# Patient Record
Sex: Female | Born: 1980 | Race: Black or African American | Hispanic: No | Marital: Single | Smoking: Never smoker
Health system: Southern US, Community
[De-identification: ages and names within clinical notes are randomized; demographics above are authoritative.]

## PROBLEM LIST (undated history)

## (undated) DIAGNOSIS — J45909 Unspecified asthma, uncomplicated: Secondary | ICD-10-CM

## (undated) DIAGNOSIS — I10 Essential (primary) hypertension: Secondary | ICD-10-CM

---

## 2014-06-19 ENCOUNTER — Encounter (HOSPITAL_BASED_OUTPATIENT_CLINIC_OR_DEPARTMENT_OTHER): Payer: Self-pay

## 2014-06-19 ENCOUNTER — Emergency Department (HOSPITAL_BASED_OUTPATIENT_CLINIC_OR_DEPARTMENT_OTHER)
Admission: EM | Admit: 2014-06-19 | Discharge: 2014-06-19 | Disposition: A | Discharge status: Home / Self Care | Payer: Medicaid Other | Attending: Emergency Medicine | Admitting: Emergency Medicine

## 2014-06-19 ENCOUNTER — Emergency Department (HOSPITAL_BASED_OUTPATIENT_CLINIC_OR_DEPARTMENT_OTHER): Payer: Medicaid Other

## 2014-06-19 DIAGNOSIS — M791 Myalgia: Secondary | ICD-10-CM | POA: Diagnosis not present

## 2014-06-19 DIAGNOSIS — M722 Plantar fascial fibromatosis: Secondary | ICD-10-CM | POA: Insufficient documentation

## 2014-06-19 DIAGNOSIS — M5441 Lumbago with sciatica, right side: Secondary | ICD-10-CM | POA: Diagnosis not present

## 2014-06-19 DIAGNOSIS — J45909 Unspecified asthma, uncomplicated: Secondary | ICD-10-CM | POA: Diagnosis not present

## 2014-06-19 DIAGNOSIS — M79671 Pain in right foot: Secondary | ICD-10-CM | POA: Diagnosis present

## 2014-06-19 HISTORY — DX: Unspecified asthma, uncomplicated: J45.909

## 2014-06-19 MED ORDER — CYCLOBENZAPRINE HCL 10 MG PO TABS: 10.0000 mg | ORAL_TABLET | Freq: Two times a day (BID) | ORAL | Status: DC | PRN

## 2014-06-19 MED ORDER — IBUPROFEN 800 MG PO TABS
800.0000 mg | ORAL_TABLET | Freq: Once | ORAL | Status: AC
  Administered 2014-06-19: 800 mg via ORAL
  Filled 2014-06-19: qty 1

## 2014-06-19 MED ORDER — IBUPROFEN 800 MG PO TABS: 800.0000 mg | ORAL_TABLET | Freq: Three times a day (TID) | ORAL | Status: DC

## 2014-06-19 NOTE — ED Provider Notes (Signed)
CSN: [id number]     Arrival date & time 06/19/14  1055 History   First MD Initiated Contact with Patient 06/19/14 1159     Chief Complaint  Patient presents with  . Foot Pain     (Consider location/radiation/quality/duration/timing/severity/associated sxs/prior Treatment) HPI Barbara Williams is a 34 year old female past medical history of asthma who presents the ER complaining of 2 days of right foot pain, along with 2 weeks of right-sided lower back pain which radiates into her knee. Patient states her symptoms began gradually, beginning with right-sided lower back pain which has a burning, tingling sensation in her right posterior thigh and right knee. Patient states she works on her feet up Department sore and typically wears shoes that are flats. She states over the past several days she notes some mild swelling to the bottom of her foot. She states she has increased pain with walking, and typically the first several steps she takes are the most painful. Patient denies injury to her back or foot. Patient denies numbness, weakness, nausea, vomiting, fever, saddle anesthesia, bowel/bladder incontinence/retention.  Past Medical History  Diagnosis Date  . Asthma    History reviewed. No pertinent past surgical history. No family history on file. History  Substance Use Topics  . Smoking status: Never Smoker   . Smokeless tobacco: Not on file  . Alcohol Use: Not on file   OB History    No data available     Review of Systems  Musculoskeletal: Positive for myalgias and arthralgias.  Neurological: Negative for weakness and numbness.      Allergies  Review of patient's allergies indicates no known allergies.  Home Medications   Prior to Admission medications   Medication Sig Start Date End Date Taking? Authorizing Provider  cyclobenzaprine (FLEXERIL) 10 MG tablet Take 1 tablet (10 mg total) by mouth 2 (two) times daily as needed for muscle spasms. 06/19/14   Monte Fantasia, PA-C    ibuprofen (ADVIL,MOTRIN) 800 MG tablet Take 800 mg by mouth every 8 (eight) hours as needed.   Yes Historical Provider, MD  ibuprofen (ADVIL,MOTRIN) 800 MG tablet Take 1 tablet (800 mg total) by mouth 3 (three) times daily. 06/19/14   Monte Fantasia, PA-C   Ht 5' (1.524 m)  Wt 180 lb (81.647 kg)  BMI 35.15 kg/m2  LMP 06/13/2014 Physical Exam  Constitutional: She is oriented to person, place, and time. She appears well-developed and well-nourished. No distress.  HENT:  Head: Normocephalic and atraumatic.  Eyes: Right eye exhibits no discharge. Left eye exhibits no discharge. No scleral icterus.  Neck: Normal range of motion.  Pulmonary/Chest: Effort normal. No respiratory distress.  Musculoskeletal: Normal range of motion.       Cervical back: Normal.       Thoracic back: Normal.       Lumbar back: She exhibits normal range of motion, no tenderness, no bony tenderness, no swelling and no edema.       Back:  Tenderness to palpation of the plantar aspect of calcaneal region and foot. No obvious erythema, edema, warmth, deformity. DP pulse 2+. Patient has full active and passive range of motion of her hip, knee, ankle.  Tenderness palpation also noted over right SI joint.  Neurological: She is alert and oriented to person, place, and time. She has normal strength. No cranial nerve deficit or sensory deficit. She displays a negative Romberg sign. Gait normal. GCS eye subscore is 4. GCS verbal subscore is 5. GCS motor subscore is 6.  Patient fully alert, answering questions appropriately in full, clear sentences. Cranial nerve II through XII grossly intact. Motor strength 5 out of 5 in all major muscle groups of upper and lower extremities. Distal sensation intact.  Skin: Skin is warm and dry. She is not diaphoretic.  Psychiatric: She has a normal mood and affect.  Nursing note and vitals reviewed.   ED Course  Procedures (including critical care time) Labs Review Labs Reviewed - No data  to display  Imaging Review Dg Foot Complete Right  06/19/2014   CLINICAL DATA:  Patient with increasing diffuse foot pain for 2 weeks. Status post lipoma removal 4 years ago. No trauma.  EXAM: RIGHT FOOT COMPLETE - 3+ VIEW  COMPARISON:  None.  FINDINGS: Normal anatomic alignment. No evidence for acute fracture or dislocation. No significant osseous degenerative change. Regional soft tissues unremarkable.  IMPRESSION: No acute osseous abnormality.   Electronically Signed   By: Annia Beltrew  Davis M.D.   On: 06/19/2014 12:59     EKG Interpretation None      MDM   Final diagnoses:  Plantar fasciitis of right foot  Right-sided low back pain with right-sided sciatica    Patient with back pain.  No neurological deficits and normal neuro exam.  Patient can walk but states is painful.  No loss of bowel or bladder control.  No concern for cauda equina.  No fever, night sweats, weight loss, h/o cancer, IVDU.  Patient's foot pain consistent with plantar fasciitis. Radiographs unremarkable for acute pathology. RICE protocol and pain medicine indicated and discussed with patient. I discussed conservative therapy with patient, discussed return precautions with patient, and strongly encouraged patient to follow-up with her primary care provider. I encouraged patient to call or return to the ER with any worsening of symptoms or should she have any questions or concerns.  Ht 5' (1.524 m)  Wt 180 lb (81.647 kg)  BMI 35.15 kg/m2  LMP 06/13/2014  Signed,  Ladona MowJoe Anzleigh Slaven, PA-C 2:00 AM    Monte FantasiaJoseph W Lorrain Rivers, PA-C 06/21/14 0200  Mirian MoMatthew Gentry, MD 06/23/14 [phone]

## 2014-06-19 NOTE — ED Notes (Signed)
Patient here with 2 weeks of right foot pain, denies injury. Reports increased pain with ambulation and increased swelling, now pain radiating up right leg

## 2014-06-19 NOTE — Discharge Instructions (Signed)
Your x-ray today was normal. Follow-up with your primary care doctor. Return to the ER if any worsening of symptoms, high fever, nausea, vomiting, numbness especially between your legs or groin, trouble with bowels or bladder, weakness, loss of function or sensation of any of your limbs.   Plantar Fasciitis Plantar fasciitis is a common condition that causes foot pain. It is soreness (inflammation) of the band of tough fibrous tissue on the bottom of the foot that runs from the heel bone (calcaneus) to the ball of the foot. The cause of this soreness may be from excessive standing, poor fitting shoes, running on hard surfaces, being overweight, having an abnormal walk, or overuse (this is common in runners) of the painful foot or feet. It is also common in aerobic exercise dancers and ballet dancers. SYMPTOMS  Most people with plantar fasciitis complain of:  Severe pain in the morning on the bottom of their foot especially when taking the first steps out of bed. This pain recedes after a few minutes of walking.  Severe pain is experienced also during walking following a long period of inactivity.  Pain is worse when walking barefoot or up stairs DIAGNOSIS   Your caregiver will diagnose this condition by examining and feeling your foot.  Special tests such as X-rays of your foot, are usually not needed. PREVENTION   Consult a sports medicine professional before beginning a new exercise program.  Walking programs offer a good workout. With walking there is a lower chance of overuse injuries common to runners. There is less impact and less jarring of the joints.  Begin all new exercise programs slowly. If problems or pain develop, decrease the amount of time or distance until you are at a comfortable level.  Wear good shoes and replace them regularly.  Stretch your foot and the heel cords at the back of the ankle (Achilles tendon) both before and after exercise.  Run or exercise on even  surfaces that are not hard. For example, asphalt is better than pavement.  Do not run barefoot on hard surfaces.  If using a treadmill, vary the incline.  Do not continue to workout if you have foot or joint problems. Seek professional help if they do not improve. HOME CARE INSTRUCTIONS   Avoid activities that cause you pain until you recover.  Use ice or cold packs on the problem or painful areas after working out.  Only take over-the-counter or prescription medicines for pain, discomfort, or fever as directed by your caregiver.  Soft shoe inserts or athletic shoes with air or gel sole cushions may be helpful.  If problems continue or become more severe, consult a sports medicine caregiver or your own health care provider. Cortisone is a potent anti-inflammatory medication that may be injected into the painful area. You can discuss this treatment with your caregiver. MAKE SURE YOU:   Understand these instructions.  Will watch your condition.  Will get help right away if you are not doing well or get worse. Document Released: 12/26/2000 Document Revised: 06/25/2011 Document Reviewed: 02/25/2008 [facility] Patient Information 2015 [place], [place]. This information is not intended to replace advice given to you by your health care provider. Make sure you discuss any questions you have with your health care provider.  Sciatica Sciatica is pain, weakness, numbness, or tingling along the path of the sciatic nerve. The nerve starts in the lower back and runs down the back of each leg. The nerve controls the muscles in the lower leg and  in the back of the knee, while also providing sensation to the back of the thigh, lower leg, and the sole of your foot. Sciatica is a symptom of another medical condition. For instance, nerve damage or certain conditions, such as a herniated disk or bone spur on the spine, pinch or put pressure on the sciatic nerve. This causes the pain, weakness, or other  sensations normally associated with sciatica. Generally, sciatica only affects one side of the body. CAUSES   Herniated or slipped disc.  Degenerative disk disease.  A pain disorder involving the narrow muscle in the buttocks (piriformis syndrome).  Pelvic injury or fracture.  Pregnancy.  Tumor (rare). SYMPTOMS  Symptoms can vary from mild to very severe. The symptoms usually travel from the low back to the buttocks and down the back of the leg. Symptoms can include:  Mild tingling or dull aches in the lower back, leg, or hip.  Numbness in the back of the calf or sole of the foot.  Burning sensations in the lower back, leg, or hip.  Sharp pains in the lower back, leg, or hip.  Leg weakness.  Severe back pain inhibiting movement. These symptoms may get worse with coughing, sneezing, laughing, or prolonged sitting or standing. Also, being overweight may worsen symptoms. DIAGNOSIS  Your caregiver will perform a physical exam to look for common symptoms of sciatica. He or she may ask you to do certain movements or activities that would trigger sciatic nerve pain. Other tests may be performed to find the cause of the sciatica. These may include:  Blood tests.  X-rays.  Imaging tests, such as an MRI or CT scan. TREATMENT  Treatment is directed at the cause of the sciatic pain. Sometimes, treatment is not necessary and the pain and discomfort goes away on its own. If treatment is needed, your caregiver may suggest:  Over-the-counter medicines to relieve pain.  Prescription medicines, such as anti-inflammatory medicine, muscle relaxants, or narcotics.  Applying heat or ice to the painful area.  Steroid injections to lessen pain, irritation, and inflammation around the nerve.  Reducing activity during periods of pain.  Exercising and stretching to strengthen your abdomen and improve flexibility of your spine. Your caregiver may suggest losing weight if the extra weight makes  the back pain worse.  Physical therapy.  Surgery to eliminate what is pressing or pinching the nerve, such as a bone spur or part of a herniated disk. HOME CARE INSTRUCTIONS   Only take over-the-counter or prescription medicines for pain or discomfort as directed by your caregiver.  Apply ice to the affected area for 20 minutes, 3-4 times a day for the first 48-72 hours. Then try heat in the same way.  Exercise, stretch, or perform your usual activities if these do not aggravate your pain.  Attend physical therapy sessions as directed by your caregiver.  Keep all follow-up appointments as directed by your caregiver.  Do not wear high heels or shoes that do not provide proper support.  Check your mattress to see if it is too soft. A firm mattress may lessen your pain and discomfort. SEEK IMMEDIATE MEDICAL CARE IF:   You lose control of your bowel or bladder (incontinence).  You have increasing weakness in the lower back, pelvis, buttocks, or legs.  You have redness or swelling of your back.  You have a burning sensation when you urinate.  You have pain that gets worse when you lie down or awakens you at night.  Your pain  is worse than you have experienced in the past.  Your pain is lasting longer than 4 weeks.  You are suddenly losing weight without reason. MAKE SURE YOU:  Understand these instructions.  Will watch your condition.  Will get help right away if you are not doing well or get worse. Document Released: 03/27/2001 Document Revised: 10/02/2011 Document Reviewed: 08/12/2011 [facility] Patient Information 2015 [place], [place]. This information is not intended to replace advice given to you by your health care provider. Make sure you discuss any questions you have with your health care provider.   Back Exercises Back exercises help treat and prevent back injuries. The goal of back exercises is to increase the strength of your abdominal and back muscles and the  flexibility of your back. These exercises should be started when you no longer have back pain. Back exercises include:  Pelvic Tilt. Lie on your back with your knees bent. Tilt your pelvis until the lower part of your back is against the floor. Hold this position 5 to 10 sec and repeat 5 to 10 times.  Knee to Chest. Pull first 1 knee up against your chest and hold for 20 to 30 seconds, repeat this with the other knee, and then both knees. This may be done with the other leg straight or bent, whichever feels better.  Sit-Ups or Curl-Ups. Bend your knees 90 degrees. Start with tilting your pelvis, and do a partial, slow sit-up, lifting your trunk only 30 to 45 degrees off the floor. Take at least 2 to 3 seconds for each sit-up. Do not do sit-ups with your knees out straight. If partial sit-ups are difficult, simply do the above but with only tightening your abdominal muscles and holding it as directed.  Hip-Lift. Lie on your back with your knees flexed 90 degrees. Push down with your feet and shoulders as you raise your hips a couple inches off the floor; hold for 10 seconds, repeat 5 to 10 times.  Back arches. Lie on your stomach, propping yourself up on bent elbows. Slowly press on your hands, causing an arch in your low back. Repeat 3 to 5 times. Any initial stiffness and discomfort should lessen with repetition over time.  Shoulder-Lifts. Lie face down with arms beside your body. Keep hips and torso pressed to floor as you slowly lift your head and shoulders off the floor. Do not overdo your exercises, especially in the beginning. Exercises may cause you some mild back discomfort which lasts for a few minutes; however, if the pain is more severe, or lasts for more than 15 minutes, do not continue exercises until you see your caregiver. Improvement with exercise therapy for back problems is slow.  See your caregivers for assistance with developing a proper back exercise program. Document Released:  05/10/2004 Document Revised: 06/25/2011 Document Reviewed: 02/01/2011 [facility] Patient Information 2015 [place], [place]. This information is not intended to replace advice given to you by your health care provider. Make sure you discuss any questions you have with your health care provider.

## 2015-02-24 ENCOUNTER — Emergency Department (HOSPITAL_BASED_OUTPATIENT_CLINIC_OR_DEPARTMENT_OTHER): Payer: Medicaid Other

## 2015-02-24 ENCOUNTER — Encounter (HOSPITAL_BASED_OUTPATIENT_CLINIC_OR_DEPARTMENT_OTHER): Payer: Self-pay | Admitting: *Deleted

## 2015-02-24 ENCOUNTER — Emergency Department (HOSPITAL_BASED_OUTPATIENT_CLINIC_OR_DEPARTMENT_OTHER)
Admission: EM | Admit: 2015-02-24 | Discharge: 2015-02-24 | Disposition: A | Discharge status: Home / Self Care | Payer: Medicaid Other | Attending: Emergency Medicine | Admitting: Emergency Medicine

## 2015-02-24 DIAGNOSIS — Z79899 Other long term (current) drug therapy: Secondary | ICD-10-CM | POA: Insufficient documentation

## 2015-02-24 DIAGNOSIS — R05 Cough: Secondary | ICD-10-CM | POA: Diagnosis present

## 2015-02-24 DIAGNOSIS — J189 Pneumonia, unspecified organism: Secondary | ICD-10-CM

## 2015-02-24 DIAGNOSIS — J159 Unspecified bacterial pneumonia: Secondary | ICD-10-CM | POA: Insufficient documentation

## 2015-02-24 DIAGNOSIS — Z79818 Long term (current) use of other agents affecting estrogen receptors and estrogen levels: Secondary | ICD-10-CM | POA: Diagnosis not present

## 2015-02-24 DIAGNOSIS — J45901 Unspecified asthma with (acute) exacerbation: Secondary | ICD-10-CM | POA: Diagnosis not present

## 2015-02-24 DIAGNOSIS — J181 Lobar pneumonia, unspecified organism: Secondary | ICD-10-CM

## 2015-02-24 DIAGNOSIS — R51 Headache: Secondary | ICD-10-CM | POA: Insufficient documentation

## 2015-02-24 MED ORDER — ALBUTEROL SULFATE (2.5 MG/3ML) 0.083% IN NEBU: 2.5000 mg | INHALATION_SOLUTION | Freq: Four times a day (QID) | RESPIRATORY_TRACT | Status: DC | PRN

## 2015-02-24 MED ORDER — PREDNISONE 20 MG PO TABS: 40.0000 mg | ORAL_TABLET | Freq: Every day | ORAL | Status: DC

## 2015-02-24 MED ORDER — ALBUTEROL SULFATE (2.5 MG/3ML) 0.083% IN NEBU
5.0000 mg | INHALATION_SOLUTION | Freq: Once | RESPIRATORY_TRACT | Status: AC
Start: 2015-02-24 — End: 2015-02-24
  Administered 2015-02-24: 5 mg via RESPIRATORY_TRACT
  Filled 2015-02-24: qty 6

## 2015-02-24 MED ORDER — AZITHROMYCIN 250 MG PO TABS: 250.0000 mg | ORAL_TABLET | Freq: Every day | ORAL | Status: DC

## 2015-02-24 MED ORDER — PREDNISONE 10 MG PO TABS
60.0000 mg | ORAL_TABLET | Freq: Once | ORAL | Status: AC
  Administered 2015-02-24: 60 mg via ORAL
  Filled 2015-02-24 (×2): qty 1

## 2015-02-24 MED ORDER — ALBUTEROL SULFATE HFA 108 (90 BASE) MCG/ACT IN AERS
1.0000 | INHALATION_SPRAY | RESPIRATORY_TRACT | Status: DC | PRN
  Administered 2015-02-24: 2 via RESPIRATORY_TRACT
  Filled 2015-02-24: qty 6.7

## 2015-02-24 MED ORDER — IPRATROPIUM BROMIDE 0.02 % IN SOLN
0.5000 mg | Freq: Once | RESPIRATORY_TRACT | Status: AC
  Administered 2015-02-24: 0.5 mg via RESPIRATORY_TRACT
  Filled 2015-02-24: qty 2.5

## 2015-02-24 NOTE — ED Notes (Signed)
MD at bedside. 

## 2015-02-24 NOTE — ED Notes (Signed)
Patient states she has a productive cough with greenish secretions and is associated nausea, bodyaches, headache and possible fever.  Has been using multiple symptom OTC meds.

## 2015-02-24 NOTE — ED Provider Notes (Signed)
CSN: [id number]     Arrival date & time 02/24/15  4098 History   First MD Initiated Contact with Patient 02/24/15 [phone]     Chief Complaint  Patient presents with  . Cough     (Consider location/radiation/quality/duration/timing/severity/associated sxs/prior Treatment) Patient is a 34 y.o. female presenting with cough. The history is provided by the patient.  Cough Cough characteristics:  Productive and hacking Sputum characteristics:  Green Severity:  Moderate Onset quality:  Gradual Duration:  3 days Timing:  Constant Progression:  Worsening Chronicity:  New Smoker: no   Context: sick contacts and upper respiratory infection   Relieved by:  Nothing Worsened by:  Activity and lying down Ineffective treatments:  Beta-agonist inhaler and decongestant Associated symptoms: chest pain, chills, fever, headaches, myalgias, shortness of breath and wheezing   Risk factors: no recent travel     Past Medical History  Diagnosis Date  . Asthma    History reviewed. No pertinent past surgical history. No family history on file. Social History  Substance Use Topics  . Smoking status: Never Smoker   . Smokeless tobacco: None  . Alcohol Use: No   OB History    No data available     Review of Systems  Constitutional: Positive for fever and chills.  Respiratory: Positive for cough, shortness of breath and wheezing.   Cardiovascular: Positive for chest pain.  Musculoskeletal: Positive for myalgias.  Neurological: Positive for headaches.  All other systems reviewed and are negative.     Allergies  Review of patient's allergies indicates no known allergies.  Home Medications   Prior to Admission medications   Medication Sig Start Date End Date Taking? Authorizing Provider  etonogestrel (IMPLANON) 68 MG IMPL implant 1 each by Subdermal route once.   Yes Historical Provider, MD  Nebulizers MISC by Does not apply route.   Yes Historical Provider, MD  albuterol (PROVENTIL) (2.5  MG/3ML) 0.083% nebulizer solution Take 3 mLs (2.5 mg total) by nebulization every 6 (six) hours as needed for wheezing or shortness of breath. 02/24/15   Gwyneth Sprout, MD  azithromycin (ZITHROMAX) 250 MG tablet Take 1 tablet (250 mg total) by mouth daily. Take first 2 tablets together, then 1 every day until finished. 02/24/15   Gwyneth Sprout, MD  cyclobenzaprine (FLEXERIL) 10 MG tablet Take 1 tablet (10 mg total) by mouth 2 (two) times daily as needed for muscle spasms. 06/19/14   Ladona Mow, PA-C  ibuprofen (ADVIL,MOTRIN) 800 MG tablet Take 800 mg by mouth every 8 (eight) hours as needed.    Historical Provider, MD  ibuprofen (ADVIL,MOTRIN) 800 MG tablet Take 1 tablet (800 mg total) by mouth 3 (three) times daily. 06/19/14   Ladona Mow, PA-C  predniSONE (DELTASONE) 20 MG tablet Take 2 tablets (40 mg total) by mouth daily. 02/24/15   Gwyneth Sprout, MD   BP 131/92 mmHg  Pulse 83  Temp(Src) 99.6 F (37.6 C) (Oral)  Resp 18  Ht 5' (1.524 m)  Wt 180 lb (81.647 kg)  BMI 35.15 kg/m2  SpO2 99%  LMP 02/16/2015 Physical Exam  Constitutional: She is oriented to person, place, and time. She appears well-developed and well-nourished. No distress.  HENT:  Head: Normocephalic and atraumatic.  Right Ear: Tympanic membrane normal.  Left Ear: Tympanic membrane normal.  Nose: Mucosal edema and rhinorrhea present.  Mouth/Throat: Posterior oropharyngeal erythema present.  Eyes: Conjunctivae and EOM are normal. Pupils are equal, round, and reactive to light.  Neck: Normal range of motion. Neck supple.  Cardiovascular:  Normal rate, regular rhythm and intact distal pulses.   No murmur heard. Pulmonary/Chest: Effort normal. No respiratory distress. She has decreased breath sounds in the left lower field. She has wheezes. She has rhonchi in the left lower field.  Abdominal: Soft. She exhibits no distension. There is no tenderness. There is no rebound and no guarding.  Musculoskeletal: Normal range of  motion. She exhibits no edema or tenderness.  Lymphadenopathy:    She has no cervical adenopathy.  Neurological: She is alert and oriented to person, place, and time.  Skin: Skin is warm and dry. No rash noted. No erythema.  Psychiatric: She has a normal mood and affect. Her behavior is normal.  Nursing note and vitals reviewed.   ED Course  Procedures (including critical care time) Labs Review Labs Reviewed - No data to display  Imaging Review Dg Chest 2 View  02/24/2015  CLINICAL DATA:  Cough. EXAM: CHEST - 2 VIEW COMPARISON:  Chest x-ray 05/10/2011 FINDINGS: The heart size is normal. Ill-defined medial left lower lobe airspace disease is noted on both the PA and lateral views. Right lung is clear. There is no edema or effusion. The visualized soft tissues and bony thorax are unremarkable. IMPRESSION: 1. Medial posterior left lower lobe pneumonia. Electronically Signed   By: Marin Robertshristopher  Mattern M.D.   On: 02/24/2015 09:19   I have personally reviewed and evaluated these images and lab results as part of my medical decision-making.   EKG Interpretation None      MDM   Final diagnoses:  CAP (community acquired pneumonia)  Asthma exacerbation  Lobar pneumonia, unspecified organism [facility](HCC)   patient is a 34 year old female with URI symptoms for the last 3 days which are worsening with persistent cough productive of green sputum, fever, body aches and shortness of breath. Patient has been using albuterol inhaler and nebulizer at home without improvement in symptoms. On exam she has rales and decreased breath sounds in the left lower lobe with an otherwise normal exam. Oxygen saturation of greater than 96% with a normal blood pressure. Temperature 99.6 orally.  X-ray today showed a medial posterior left lower lobe pneumonia. Patient given refills of her albuterol as well as prednisone and azithromycin for pneumonia. She was given strict return precautions and she was discharged  home.    Gwyneth SproutWhitney Ruhama Lehew, MD 02/24/15 [phone]

## 2015-06-01 ENCOUNTER — Emergency Department (HOSPITAL_BASED_OUTPATIENT_CLINIC_OR_DEPARTMENT_OTHER)
Admission: EM | Admit: 2015-06-01 | Discharge: 2015-06-01 | Disposition: A | Discharge status: Home / Self Care | Payer: Medicaid Other | Attending: Emergency Medicine | Admitting: Emergency Medicine

## 2015-06-01 ENCOUNTER — Encounter (HOSPITAL_BASED_OUTPATIENT_CLINIC_OR_DEPARTMENT_OTHER): Payer: Self-pay

## 2015-06-01 DIAGNOSIS — M542 Cervicalgia: Secondary | ICD-10-CM | POA: Diagnosis not present

## 2015-06-01 DIAGNOSIS — J3489 Other specified disorders of nose and nasal sinuses: Secondary | ICD-10-CM | POA: Diagnosis not present

## 2015-06-01 DIAGNOSIS — Z792 Long term (current) use of antibiotics: Secondary | ICD-10-CM | POA: Diagnosis not present

## 2015-06-01 DIAGNOSIS — Z3202 Encounter for pregnancy test, result negative: Secondary | ICD-10-CM | POA: Insufficient documentation

## 2015-06-01 DIAGNOSIS — Z79899 Other long term (current) drug therapy: Secondary | ICD-10-CM | POA: Diagnosis not present

## 2015-06-01 DIAGNOSIS — J029 Acute pharyngitis, unspecified: Secondary | ICD-10-CM | POA: Insufficient documentation

## 2015-06-01 DIAGNOSIS — J45909 Unspecified asthma, uncomplicated: Secondary | ICD-10-CM | POA: Insufficient documentation

## 2015-06-01 DIAGNOSIS — I10 Essential (primary) hypertension: Secondary | ICD-10-CM | POA: Diagnosis not present

## 2015-06-01 DIAGNOSIS — R0981 Nasal congestion: Secondary | ICD-10-CM

## 2015-06-01 DIAGNOSIS — H81399 Other peripheral vertigo, unspecified ear: Secondary | ICD-10-CM | POA: Diagnosis not present

## 2015-06-01 DIAGNOSIS — G44209 Tension-type headache, unspecified, not intractable: Secondary | ICD-10-CM | POA: Diagnosis not present

## 2015-06-01 DIAGNOSIS — R51 Headache: Secondary | ICD-10-CM | POA: Diagnosis present

## 2015-06-01 HISTORY — DX: Essential (primary) hypertension: I10

## 2015-06-01 LAB — URINE MICROSCOPIC-ADD ON: RBC / HPF: NONE SEEN RBC/hpf (ref 0–5)

## 2015-06-01 LAB — URINALYSIS, ROUTINE W REFLEX MICROSCOPIC
BILIRUBIN URINE: NEGATIVE
Glucose, UA: NEGATIVE mg/dL
Hgb urine dipstick: NEGATIVE
KETONES UR: NEGATIVE mg/dL
NITRITE: NEGATIVE
PH: 8 (ref 5.0–8.0)
PROTEIN: NEGATIVE mg/dL
Specific Gravity, Urine: 1.021 (ref 1.005–1.030)

## 2015-06-01 LAB — PREGNANCY, URINE: Preg Test, Ur: NEGATIVE

## 2015-06-01 MED ORDER — METHOCARBAMOL 500 MG PO TABS: 1000.0000 mg | ORAL_TABLET | Freq: Four times a day (QID) | ORAL | Status: DC | PRN

## 2015-06-01 MED ORDER — METHOCARBAMOL 500 MG PO TABS
1000.0000 mg | ORAL_TABLET | Freq: Once | ORAL | Status: AC
  Administered 2015-06-01: 1000 mg via ORAL
  Filled 2015-06-01: qty 2

## 2015-06-01 MED ORDER — MECLIZINE HCL 25 MG PO TABS
25.0000 mg | ORAL_TABLET | Freq: Once | ORAL | Status: AC
  Administered 2015-06-01: 25 mg via ORAL
  Filled 2015-06-01: qty 1

## 2015-06-01 MED ORDER — MECLIZINE HCL 25 MG PO TABS: 25.0000 mg | ORAL_TABLET | Freq: Three times a day (TID) | ORAL | Status: DC | PRN

## 2015-06-01 MED ORDER — ONDANSETRON 4 MG PO TBDP
4.0000 mg | ORAL_TABLET | Freq: Once | ORAL | Status: AC
  Administered 2015-06-01: 4 mg via ORAL
  Filled 2015-06-01: qty 1

## 2015-06-01 MED ORDER — KETOROLAC TROMETHAMINE 60 MG/2ML IM SOLN
60.0000 mg | Freq: Once | INTRAMUSCULAR | Status: AC
  Administered 2015-06-01: 60 mg via INTRAMUSCULAR
  Filled 2015-06-01: qty 2

## 2015-06-01 MED ORDER — IBUPROFEN 600 MG PO TABS: 600.0000 mg | ORAL_TABLET | Freq: Four times a day (QID) | ORAL | Status: DC | PRN

## 2015-06-01 MED ORDER — LORATADINE 10 MG PO TABS: 10.0000 mg | ORAL_TABLET | Freq: Every day | ORAL | Status: DC

## 2015-06-01 MED ORDER — IBUPROFEN 400 MG PO TABS
600.0000 mg | ORAL_TABLET | Freq: Once | ORAL | Status: AC
  Administered 2015-06-01: 600 mg via ORAL
  Filled 2015-06-01: qty 1

## 2015-06-01 NOTE — Discharge Instructions (Signed)
Vertigo Vertigo means you feel like you or your surroundings are moving when they are not. Vertigo can be dangerous if it occurs when you are at work, driving, or performing difficult activities.  CAUSES  Vertigo occurs when there is a conflict of signals sent to your brain from the visual and sensory systems in your body. There are many different causes of vertigo, including:  Infections, especially in the inner ear.  A bad reaction to a drug or misuse of alcohol and medicines.  Withdrawal from drugs or alcohol.  Rapidly changing positions, such as lying down or rolling over in bed.  A migraine headache.  Decreased blood flow to the brain.  Increased pressure in the brain from a head injury, infection, tumor, or bleeding. SYMPTOMS  You may feel as though the world is spinning around or you are falling to the ground. Because your balance is upset, vertigo can cause nausea and vomiting. You may have involuntary eye movements (nystagmus). DIAGNOSIS  Vertigo is usually diagnosed by physical exam. If the cause of your vertigo is unknown, your caregiver may perform imaging tests, such as an MRI scan (magnetic resonance imaging). TREATMENT  Most cases of vertigo resolve on their own, without treatment. Depending on the cause, your caregiver may prescribe certain medicines. If your vertigo is related to body position issues, your caregiver may recommend movements or procedures to correct the problem. In rare cases, if your vertigo is caused by certain inner ear problems, you may need surgery. HOME CARE INSTRUCTIONS   Follow your caregiver's instructions.  Avoid driving.  Avoid operating heavy machinery.  Avoid performing any tasks that would be dangerous to you or others during a vertigo episode.  Tell your caregiver if you notice that certain medicines seem to be causing your vertigo. Some of the medicines used to treat vertigo episodes can actually make them worse in some people. SEEK  IMMEDIATE MEDICAL CARE IF:   Your medicines do not relieve your vertigo or are making it worse.  You develop problems with talking, walking, weakness, or using your arms, hands, or legs.  You develop severe headaches.  Your nausea or vomiting continues or gets worse.  You develop visual changes.  A family member notices behavioral changes.  Your condition gets worse. MAKE SURE YOU:  Understand these instructions.  Will watch your condition.  Will get help right away if you are not doing well or get worse.   This information is not intended to replace advice given to you by your health care provider. Make sure you discuss any questions you have with your health care provider.   Document Released: 01/10/2005 Document Revised: 06/25/2011 Document Reviewed: 07/26/2014 Elsevier Interactive Patient Education 2016 [employer]  Tension Headache A tension headache is a feeling of pain, pressure, or aching that is often felt over the front and sides of the head. The pain can be dull, or it can feel tight (constricting). Tension headaches are not normally associated with nausea or vomiting, and they do not get worse with physical activity. Tension headaches can last from 30 minutes to several days. This is the most common type of headache. CAUSES The exact cause of this condition is not known. Tension headaches often begin after stress, anxiety, or depression. Other triggers may include:  Alcohol.  Too much caffeine, or caffeine withdrawal.  Respiratory infections, such as colds, flu, or sinus infections.  Dental problems or teeth clenching.  Fatigue.  Holding your head and neck in the same position  for a long period of time, such as while using a computer.  Smoking. SYMPTOMS Symptoms of this condition include:  A feeling of pressure around the head.  Dull, aching head pain.  Pain felt over the front and sides of the head.  Tenderness in the muscles of the head, neck,  and shoulders. DIAGNOSIS This condition may be diagnosed based on your symptoms and a physical exam. Tests may be done, such as a CT scan or an MRI of your head. These tests may be done if your symptoms are severe or unusual. TREATMENT This condition may be treated with lifestyle changes and medicines to help relieve symptoms. HOME CARE INSTRUCTIONS Managing Pain  Take over-the-counter and prescription medicines only as told by your health care provider.  Lie down in a dark, quiet room when you have a headache.  If directed, apply ice to the head and neck area:  Put ice in a plastic bag.  Place a towel between your skin and the bag.  Leave the ice on for 20 minutes, 2-3 times per day.  Use a heating pad or a hot shower to apply heat to the head and neck area as told by your health care provider. Eating and Drinking  Eat meals on a regular schedule.  Limit alcohol use.  Decrease your caffeine intake, or stop using caffeine. General Instructions  Keep all follow-up visits as told by your health care provider. This is important.  Keep a headache journal to help find out what may trigger your headaches. For example, write down:  What you eat and drink.  How much sleep you get.  Any change to your diet or medicines.  Try massage or other relaxation techniques.  Limit stress.  Sit up straight, and avoid tensing your muscles.  Do not use tobacco products, including cigarettes, chewing tobacco, or e-cigarettes. If you need help quitting, ask your health care provider.  Exercise regularly as told by your health care provider.  Get 7-9 hours of sleep, or the amount recommended by your health care provider. SEEK MEDICAL CARE IF:  Your symptoms are not helped by medicine.  You have a headache that is different from what you normally experience.  You have nausea or you vomit.  You have a fever. SEEK IMMEDIATE MEDICAL CARE IF:  Your headache becomes severe.  You have  repeated vomiting.  You have a stiff neck.  You have a loss of vision.  You have problems with speech.  You have pain in your eye or ear.  You have muscular weakness or loss of muscle control.  You lose your balance or you have trouble walking.  You feel faint or you pass out.  You have confusion.   This information is not intended to replace advice given to you by your health care provider. Make sure you discuss any questions you have with your health care provider.   Document Released: 04/02/2005 Document Revised: 12/22/2014 Document Reviewed: 07/26/2014 Elsevier Interactive Patient Education 2016 [employer] Sinusitis, Adult Sinusitis is redness, soreness, and inflammation of the paranasal sinuses. Paranasal sinuses are air pockets within the bones of your face. They are located beneath your eyes, in the middle of your forehead, and above your eyes. In healthy paranasal sinuses, mucus is able to drain out, and air is able to circulate through them by way of your nose. However, when your paranasal sinuses are inflamed, mucus and air can become trapped. This can allow bacteria and other germs to grow and  cause infection. Sinusitis can develop quickly and last only a short time (acute) or continue over a long period (chronic). Sinusitis that lasts for more than 12 weeks is considered chronic. CAUSES Causes of sinusitis include:  Allergies.  Structural abnormalities, such as displacement of the cartilage that separates your nostrils (deviated septum), which can decrease the air flow through your nose and sinuses and affect sinus drainage.  Functional abnormalities, such as when the small hairs (cilia) that line your sinuses and help remove mucus do not work properly or are not present. SIGNS AND SYMPTOMS Symptoms of acute and chronic sinusitis are the same. The primary symptoms are pain and pressure around the affected sinuses. Other symptoms include:  Upper  toothache.  Earache.  Headache.  Bad breath.  Decreased sense of smell and taste.  A cough, which worsens when you are lying flat.  Fatigue.  Fever.  Thick drainage from your nose, which often is green and may contain pus (purulent).  Swelling and warmth over the affected sinuses. DIAGNOSIS Your health care provider will perform a physical exam. During your exam, your health care provider may perform any of the following to help determine if you have acute sinusitis or chronic sinusitis:  Look in your nose for signs of abnormal growths in your nostrils (nasal polyps).  Tap over the affected sinus to check for signs of infection.  View the inside of your sinuses using an imaging device that has a light attached (endoscope). If your health care provider suspects that you have chronic sinusitis, one or more of the following tests may be recommended:  Allergy tests.  Nasal culture. A sample of mucus is taken from your nose, sent to a lab, and screened for bacteria.  Nasal cytology. A sample of mucus is taken from your nose and examined by your health care provider to determine if your sinusitis is related to an allergy. TREATMENT Most cases of acute sinusitis are related to a viral infection and will resolve on their own within 10 days. Sometimes, medicines are prescribed to help relieve symptoms of both acute and chronic sinusitis. These may include pain medicines, decongestants, nasal steroid sprays, or saline sprays. However, for sinusitis related to a bacterial infection, your health care provider will prescribe antibiotic medicines. These are medicines that will help kill the bacteria causing the infection. Rarely, sinusitis is caused by a fungal infection. In these cases, your health care provider will prescribe antifungal medicine. For some cases of chronic sinusitis, surgery is needed. Generally, these are cases in which sinusitis recurs more than 3 times per year, despite  other treatments. HOME CARE INSTRUCTIONS  Drink plenty of water. Water helps thin the mucus so your sinuses can drain more easily.  Use a humidifier.  Inhale steam 3-4 times a day (for example, sit in the bathroom with the shower running).  Apply a warm, moist washcloth to your face 3-4 times a day, or as directed by your health care provider.  Use saline nasal sprays to help moisten and clean your sinuses.  Take medicines only as directed by your health care provider.  If you were prescribed either an antibiotic or antifungal medicine, finish it all even if you start to feel better. SEEK IMMEDIATE MEDICAL CARE IF:  You have increasing pain or severe headaches.  You have nausea, vomiting, or drowsiness.  You have swelling around your face.  You have vision problems.  You have a stiff neck.  You have difficulty breathing.   This information  is not intended to replace advice given to you by your health care provider. Make sure you discuss any questions you have with your health care provider.   Document Released: 04/02/2005 Document Revised: 04/23/2014 Document Reviewed: 04/17/2011 Elsevier Interactive Patient Education [employer]

## 2015-06-01 NOTE — ED Notes (Signed)
HA x 2 days-vomited x 2 yesterday-recent HTN dx with meds started-NAD-steady gait

## 2015-06-01 NOTE — ED Provider Notes (Signed)
CSN: [id number]     Arrival date & time 06/01/15  1523 History   First MD Initiated Contact with Patient 06/01/15 1552     Chief Complaint  Patient presents with  . Headache     (Consider location/radiation/quality/duration/timing/severity/associated sxs/prior Treatment) HPI She presents with gradual onset frontal headache. States this is different than her migraine headaches. She has associated dizziness which she describes as room spinning. Also complains of nausea with vomiting 2. She said no fever or chills. Patient states she does have "sinus" issues. Describes sinus pressure and nasal congestion yesterday. Denies any ear pain or tinnitus. No focal weakness or numbness. No photophobia or phonophobia. Patient is under increased stress. She states that she is having pain to the right side of her neck. She denies stiffness or trauma. Past Medical History  Diagnosis Date  . Asthma   . Hypertension    History reviewed. No pertinent past surgical history. No family history on file. Social History  Substance Use Topics  . Smoking status: Never Smoker   . Smokeless tobacco: None  . Alcohol Use: No   OB History    No data available     Review of Systems  Constitutional: Positive for fatigue. Negative for fever and chills.  HENT: Positive for congestion, sinus pressure and sore throat. Negative for ear pain.   Eyes: Negative for visual disturbance.  Respiratory: Negative for cough and shortness of breath.   Cardiovascular: Negative for chest pain.  Gastrointestinal: Positive for nausea and vomiting. Negative for abdominal pain and diarrhea.  Genitourinary: Negative for dysuria, frequency, flank pain and dyspareunia.  Musculoskeletal: Positive for myalgias and neck pain. Negative for back pain and neck stiffness.  Skin: Negative for pallor, rash and wound.  Neurological: Positive for dizziness and headaches. Negative for syncope, weakness, light-headedness and numbness.  All other  systems reviewed and are negative.     Allergies  Review of patient's allergies indicates no known allergies.  Home Medications   Prior to Admission medications   Medication Sig Start Date End Date Taking? Authorizing Provider  hydrochlorothiazide (HYDRODIURIL) 25 MG tablet Take 25 mg by mouth daily.   Yes Historical Provider, MD  metroNIDAZOLE (FLAGYL) 500 MG tablet Take 500 mg by mouth 2 (two) times daily.   Yes Historical Provider, MD  etonogestrel (IMPLANON) 68 MG IMPL implant 1 each by Subdermal route once.    Historical Provider, MD  ibuprofen (ADVIL,MOTRIN) 600 MG tablet Take 1 tablet (600 mg total) by mouth every 6 (six) hours as needed. 06/01/15   Loren Racer, MD  loratadine (CLARITIN) 10 MG tablet Take 1 tablet (10 mg total) by mouth daily. 06/01/15   Loren Racer, MD  meclizine (ANTIVERT) 25 MG tablet Take 1 tablet (25 mg total) by mouth 3 (three) times daily as needed for dizziness. 06/01/15   Loren Racer, MD  methocarbamol (ROBAXIN) 500 MG tablet Take 2 tablets (1,000 mg total) by mouth every 6 (six) hours as needed for muscle spasms. 06/01/15   Loren Racer, MD   BP 119/94 mmHg  Pulse 78  Temp(Src) 97.8 F (36.6 C) (Oral)  Resp 16  Ht 5' (1.524 m)  Wt 180 lb (81.647 kg)  BMI 35.15 kg/m2  SpO2 100%  LMP 05/26/2015 Physical Exam  Constitutional: She is oriented to person, place, and time. She appears well-developed and well-nourished. No distress.  HENT:  Head: Normocephalic and atraumatic.  Mouth/Throat: Oropharynx is clear and moist. No oropharyngeal exudate.  Bilateral nasal mucosal edema. Patient also has  bulging bilateral TMs without erythema. No sinus tenderness with percussion. Patient has right-sided temporal tenderness with palpation. No obvious trauma. No temporal artery tenderness.  Eyes: EOM are normal. Pupils are equal, round, and reactive to light.  Fatigable rotary nystagmus  Neck: Normal range of motion. Neck supple.  Right-sided  paracervical muscular tenderness with palpation. No meningismus. No lymphadenopathy.  Cardiovascular: Normal rate and regular rhythm.  Exam reveals no gallop and no friction rub.   No murmur heard. Pulmonary/Chest: Effort normal and breath sounds normal. No respiratory distress. She has no wheezes. She has no rales. She exhibits no tenderness.  Abdominal: Soft. Bowel sounds are normal. She exhibits no distension and no mass. There is no tenderness. There is no rebound and no guarding.  Musculoskeletal: Normal range of motion. She exhibits no edema or tenderness.  No thoracic or lumbar tenderness. No CVA tenderness. No lower extremity swelling or pain. Distal pulses equal and intact.  Neurological: She is alert and oriented to person, place, and time.  Patient is alert and oriented x3 with clear, goal oriented speech. Patient has 5/5 motor in all extremities. Sensation is intact to light touch. Bilateral finger-to-nose is normal with no signs of dysmetria. Patient has a normal gait and walks without assistance.  Skin: Skin is warm and dry. No rash noted. No erythema.  Psychiatric: She has a normal mood and affect. Her behavior is normal.  Nursing note and vitals reviewed.   ED Course  Procedures (including critical care time) Labs Review Labs Reviewed  URINALYSIS, ROUTINE W REFLEX MICROSCOPIC (NOT AT [facility]) - Abnormal; Notable for the following:    APPearance CLOUDY (*)    Leukocytes, UA TRACE (*)    All other components within normal limits  URINE MICROSCOPIC-ADD ON - Abnormal; Notable for the following:    Squamous Epithelial / LPF 6-30 (*)    Bacteria, UA RARE (*)    All other components within normal limits  PREGNANCY, URINE    Imaging Review No results found. I have personally reviewed and evaluated these images and lab results as part of my medical decision-making.   EKG Interpretation None      MDM   Final diagnoses:  Sinus congestion  Peripheral vertigo, unspecified  laterality  Muscle tension headache    Patient states she is feeling much better. Dizziness is significantly improved. Headache is improved but is starting to return She is ambulating without any difficulty. Symptoms consistent with peripheral vertigo. Likely due nasal congestion and sinus disease. Patient also has tenderness to the scalp and paracervical muscles. This seems to indicate more of a tension type headache. No evidence of meningitis. Patient is very well-appearing. Genesis need to return for any worsening symptoms or concerns.    Loren Racer, MD 06/01/15 [phone]

## 2016-01-31 ENCOUNTER — Emergency Department (HOSPITAL_BASED_OUTPATIENT_CLINIC_OR_DEPARTMENT_OTHER)
Admission: EM | Admit: 2016-01-31 | Discharge: 2016-01-31 | Disposition: A | Discharge status: Home / Self Care | Payer: Self-pay | Attending: Emergency Medicine | Admitting: Emergency Medicine

## 2016-01-31 ENCOUNTER — Encounter (HOSPITAL_BASED_OUTPATIENT_CLINIC_OR_DEPARTMENT_OTHER): Payer: Self-pay

## 2016-01-31 ENCOUNTER — Emergency Department (HOSPITAL_BASED_OUTPATIENT_CLINIC_OR_DEPARTMENT_OTHER): Payer: Self-pay

## 2016-01-31 DIAGNOSIS — H53149 Visual discomfort, unspecified: Secondary | ICD-10-CM | POA: Insufficient documentation

## 2016-01-31 DIAGNOSIS — J45909 Unspecified asthma, uncomplicated: Secondary | ICD-10-CM | POA: Insufficient documentation

## 2016-01-31 DIAGNOSIS — R519 Headache, unspecified: Secondary | ICD-10-CM

## 2016-01-31 DIAGNOSIS — M542 Cervicalgia: Secondary | ICD-10-CM | POA: Insufficient documentation

## 2016-01-31 DIAGNOSIS — R110 Nausea: Secondary | ICD-10-CM | POA: Insufficient documentation

## 2016-01-31 DIAGNOSIS — R51 Headache: Secondary | ICD-10-CM | POA: Insufficient documentation

## 2016-01-31 MED ORDER — CYCLOBENZAPRINE HCL 10 MG PO TABS: 10.0000 mg | ORAL_TABLET | Freq: Three times a day (TID) | ORAL | 0 refills | Status: DC | PRN

## 2016-01-31 MED ORDER — DIPHENHYDRAMINE HCL 50 MG/ML IJ SOLN
50.0000 mg | Freq: Once | INTRAMUSCULAR | Status: AC
  Administered 2016-01-31: 50 mg via INTRAVENOUS
  Filled 2016-01-31: qty 1

## 2016-01-31 MED ORDER — METOCLOPRAMIDE HCL 5 MG/ML IJ SOLN
10.0000 mg | Freq: Once | INTRAMUSCULAR | Status: AC
  Administered 2016-01-31: 10 mg via INTRAVENOUS
  Filled 2016-01-31: qty 2

## 2016-01-31 MED ORDER — KETOROLAC TROMETHAMINE 30 MG/ML IJ SOLN
30.0000 mg | Freq: Once | INTRAMUSCULAR | Status: AC
  Administered 2016-01-31: 30 mg via INTRAVENOUS
  Filled 2016-01-31: qty 1

## 2016-01-31 NOTE — ED Triage Notes (Signed)
C/o woke with HA this am, dizziness, nausea-NAD-steady gait

## 2016-01-31 NOTE — ED Notes (Signed)
MD at bedside. 

## 2016-01-31 NOTE — ED Provider Notes (Signed)
[facility]   CSN: [id number] Arrival date & time: 01/31/16  1759  By signing my name below, I, Barbara Williams, attest that this documentation has been prepared under the direction and in the presence of [name], MD. Electronically Signed: Phillis Williams, ED Scribe. 01/31/16. 8:22 PM.  History   Chief Complaint Chief Complaint  Patient presents with  . Headache   The history is provided by the patient. No language interpreter was used.   HPI Comments: Barbara Williams is a 35 y.o. Female with a hx of HTN who presents to the Emergency Department complaining of a gradually worsening right sided headache that begins in the occipital region and radiates forward onset 11 hours ago. Pt reports associated nausea, photophobia, and dizziness. Pt rates her pain 8/10. She says that she woke up this morning with a mild headache that has progressively worsened at 9 AM. Pt says that she feels pain and tingling on her right side. She has been having problems with numbness and weakness in the right arm over the past year. She has been evaluated for this in the past and told it was arthritis. Pt has has been having headaches a few times a week over the past couple of months. She says that her headache today feels similar to the past headaches, but the pain in the occipital region is new. She says that her headaches will occasionally feel like earaches or dental pain. Pt has been taking Motrin for her symptoms to no relief. She denies fever, chills, visual changes, or vomiting.   Past Medical History:  Diagnosis Date  . Asthma   . Hypertension     There are no active problems to display for this patient.   History reviewed. No pertinent surgical history.  OB History    No data available       Home Medications    Prior to Admission medications   Medication Sig Start Date End Date Taking? Authorizing Provider  cyclobenzaprine (FLEXERIL) 10 MG tablet Take 1 tablet  (10 mg total) by mouth 3 (three) times daily as needed for muscle spasms. 01/31/16   [name], MD    Family History No family history on file.  Social History Social History  Substance Use Topics  . Smoking status: Never Smoker  . Smokeless tobacco: Never Used  . Alcohol use No     Allergies   Review of patient's allergies indicates no known allergies.   Review of Systems Review of Systems  Constitutional: Negative for chills and fever.  Eyes: Positive for photophobia. Negative for visual disturbance.  Gastrointestinal: Positive for nausea. Negative for vomiting.  Neurological: Positive for dizziness and headaches.  All other systems reviewed and are negative.  Physical Exam Updated Vital Signs BP 131/91 (BP Location: Left Arm)   Pulse 85   Temp 98.1 F (36.7 C) (Oral)   Resp 18   Ht 5' (1.524 m)   Wt 174 lb (78.9 kg)   LMP 01/10/2016   SpO2 100%   BMI 33.98 kg/m   Physical Exam  Constitutional: She is oriented to person, place, and time. She appears well-developed and well-nourished.  HENT:  Head: Normocephalic and atraumatic.  Right Ear: External ear normal.  Left Ear: External ear normal.  Nose: Nose normal.  Eyes: EOM are normal. Pupils are equal, round, and reactive to light. Right eye exhibits no discharge. Left eye exhibits no discharge.  Neck: Normal range of motion. Neck supple. Muscular tenderness present. No spinous  process tenderness present.    Cardiovascular: Normal rate, regular rhythm and normal heart sounds.   Pulses:      Radial pulses are 2+ on the right side, and 2+ on the left side.       Dorsalis pedis pulses are 2+ on the right side, and 2+ on the left side.  Pulmonary/Chest: Effort normal and breath sounds normal.  Abdominal: Soft. There is no tenderness.  Neurological: She is alert and oriented to person, place, and time.  CN 3-12 grossly intact. 5/5 strength in all 4 extremities. Grossly normal sensation. Normal finger to  nose.   Skin: Skin is warm and dry.  Nursing note and vitals reviewed.  ED Treatments / Results  DIAGNOSTIC STUDIES: Oxygen Saturation is 100% on RA, normal by my interpretation.    COORDINATION OF CARE: 8:21 PM-Discussed treatment plan which includes CT scan and IV medications with pt at bedside and pt agreed to plan.    Labs (all labs ordered are listed, but only abnormal results are displayed) Labs Reviewed - No data to display  EKG  EKG Interpretation None       Radiology Dg Cervical Spine Complete  Result Date: 01/31/2016 CLINICAL DATA:  Right-sided neck pain for 3 months. EXAM: CERVICAL SPINE - COMPLETE 4+ VIEW COMPARISON:  None. FINDINGS: There is no evidence of cervical spine fracture or prevertebral soft tissue swelling. Alignment is normal. No other significant bone abnormalities are identified. IMPRESSION: Negative cervical spine radiographs. Electronically Signed   By: Lupita RaiderJames  Green Jr, M.D.   On: 01/31/2016 21:17   Ct Head Wo Contrast  Result Date: 01/31/2016 CLINICAL DATA:  Right-sided headache over the last 2 months. EXAM: CT HEAD WITHOUT CONTRAST TECHNIQUE: Contiguous axial images were obtained from the base of the skull through the vertex without intravenous contrast. COMPARISON:  None. FINDINGS: Brain: No evidence of malformation, atrophy, old or acute small or large vessel infarction, mass lesion, hemorrhage, hydrocephalus or extra-axial collection. No evidence of pituitary lesion. Vascular: No vascular calcification.  No hyperdense vessels. Skull: Normal.  No fracture or focal bone lesion. Sinuses/Orbits: Visualized sinuses are clear. No fluid in the middle ears or mastoids. Visualized orbits are normal. Other: None significant IMPRESSION: Normal head CT Electronically Signed   By: Paulina FusiMark  Shogry M.D.   On: 01/31/2016 21:24    Procedures Procedures (including critical care time)  Medications Ordered in ED Medications  ketorolac (TORADOL) 30 MG/ML injection 30  mg (30 mg Intravenous Given 01/31/16 2039)  metoCLOPramide (REGLAN) injection 10 mg (10 mg Intravenous Given 01/31/16 2040)  diphenhydrAMINE (BENADRYL) injection 50 mg (50 mg Intravenous Given 01/31/16 2039)     Initial Impression / Assessment and Plan / ED Course  I have reviewed the triage vital signs and the nursing notes.  Pertinent labs & imaging results that were available during my care of the patient were reviewed by me and considered in my medical decision making (see chart for details).  Clinical Course  Comment By Time  Her neuro exam now is normal. Possibly there is a MSK component to pain especially given tenderness. Reglan, benadryl, toradol, oral fluids. CT head given headache for months, likely will need neuro referral. Pricilla LovelessScott Sheccid Lahmann, MD 10/17 2022  Pain is much better. CT and x-ray are unremarkable. Will refer to neurology. My suspicion for meningitis or subarachnoid hemorrhage is quite low. This appears to be a subacute/chronic issue. No focal neuro findings. Given there seems to be a muscular component, continue NSAIDs and will give Flexeril.  Counseled on possible side effects. [name], MD 10/17 2204     Final Clinical Impressions(s) / ED Diagnoses   Final diagnoses:  Right-sided headache  Neck pain on right side   I personally performed the services described in this documentation, which was scribed in my presence. The recorded information has been reviewed and is accurate.   New Prescriptions Discharge Medication List as of 01/31/2016 10:04 PM    START taking these medications   Details  cyclobenzaprine (FLEXERIL) 10 MG tablet Take 1 tablet (10 mg total) by mouth 3 (three) times daily as needed for muscle spasms., Starting Tue 01/31/2016, Print         [name], MD 01/31/16 2317

## 2016-08-13 ENCOUNTER — Encounter (HOSPITAL_BASED_OUTPATIENT_CLINIC_OR_DEPARTMENT_OTHER): Payer: Self-pay

## 2016-08-13 ENCOUNTER — Emergency Department (HOSPITAL_BASED_OUTPATIENT_CLINIC_OR_DEPARTMENT_OTHER)
Admission: EM | Admit: 2016-08-13 | Discharge: 2016-08-13 | Disposition: A | Discharge status: Home / Self Care | Payer: Medicaid Other | Attending: Emergency Medicine | Admitting: Emergency Medicine

## 2016-08-13 DIAGNOSIS — R51 Headache: Secondary | ICD-10-CM | POA: Insufficient documentation

## 2016-08-13 DIAGNOSIS — R531 Weakness: Secondary | ICD-10-CM | POA: Insufficient documentation

## 2016-08-13 DIAGNOSIS — R519 Headache, unspecified: Secondary | ICD-10-CM

## 2016-08-13 DIAGNOSIS — Z5321 Procedure and treatment not carried out due to patient leaving prior to being seen by health care provider: Secondary | ICD-10-CM | POA: Insufficient documentation

## 2016-08-13 DIAGNOSIS — I10 Essential (primary) hypertension: Secondary | ICD-10-CM | POA: Insufficient documentation

## 2016-08-13 DIAGNOSIS — J45909 Unspecified asthma, uncomplicated: Secondary | ICD-10-CM | POA: Insufficient documentation

## 2016-08-13 LAB — PREGNANCY, URINE: Preg Test, Ur: NEGATIVE

## 2016-08-13 MED ORDER — IBUPROFEN 400 MG PO TABS
600.0000 mg | ORAL_TABLET | Freq: Once | ORAL | Status: AC
  Administered 2016-08-13: 600 mg via ORAL
  Filled 2016-08-13: qty 1

## 2016-08-13 NOTE — ED Triage Notes (Signed)
c/o HA, dizziness x 3 days-dx with HTN Nov 2017-took samples then noncompliant-NAD-steady gait

## 2016-08-13 NOTE — ED Triage Notes (Signed)
c/o HA, dizziness x 3 days-dx with HTN Nov 2017-completed samples then noncompliant-pt NAD-steady gait

## 2016-08-13 NOTE — Discharge Instructions (Signed)
Please follow-up with your primary care provider regarding today's visit. Please see attached to resource guide for resources on primary care providers. Please take ibuprofen or naproxen as needed for future headaches if you have similar symptoms. Drink at least 8 glasses of water throughout the day.  Get help right away if: Your headache becomes severe. You have repeated vomiting. You have a stiff neck. You have a loss of vision. You have problems with speech. You have pain in the eye or ear. You have muscular weakness or loss of muscle control. You lose your balance or have trouble walking. You feel faint or pass out. You have confusion.

## 2016-08-13 NOTE — ED Notes (Signed)
Notified by reg clerk that pt left  

## 2016-08-13 NOTE — ED Provider Notes (Signed)
[facility]   CSN: [id number] Arrival date & time: 08/13/16  1302     History   Chief Complaint Chief Complaint  Patient presents with  . Headache    HPI Barbara Williams is a 36 y.o. female with PMHx of asthma, HTN presents today with complaints of headache since Saturday. She states that her headache has been gradually worsening, intermittent, located on her forehead bilaterally, 6/10. She admits to mild radiation to her neck. She reports associated dizziness and blurry vision. She states she has tried nothing for her symptoms. She states nothing makes it better or worse. She denies any fevers, chills, nausea, vomiting, double vision, photophobia, trauma. She denies any neck pain or neck stiffness. She states that her headache is similar to her previous headaches with no new symptoms. She states she has been diagnosed with hypertension in the past about one year ago, but was told she did not need any hypertensive medications at that time by her PCP. She states she is concerned that her headache may be from an increased blood pressure.   The history is provided by the patient. No language interpreter was used.  Headache   Pertinent negatives include no fever, no shortness of breath, no nausea and no vomiting.    Past Medical History:  Diagnosis Date  . Asthma   . Hypertension     There are no active problems to display for this patient.   History reviewed. No pertinent surgical history.  OB History    No data available       Home Medications    Prior to Admission medications   Not on File    Family History No family history on file.  Social History Social History  Substance Use Topics  . Smoking status: Never Smoker  . Smokeless tobacco: Never Used  . Alcohol use No     Allergies   Patient has no known allergies.   Review of Systems Review of Systems  Constitutional: Negative for appetite change, chills and fever.  HENT:  Negative for congestion.   Eyes: Negative for photophobia and visual disturbance.  Respiratory: Negative for shortness of breath.   Cardiovascular: Negative for chest pain.  Gastrointestinal: Negative for abdominal pain, diarrhea, nausea and vomiting.  Musculoskeletal: Negative for neck pain and neck stiffness.  Skin: Negative for wound.  Neurological: Positive for weakness (generalized) and headaches. Negative for seizures and numbness.  All other systems reviewed and are negative.    Physical Exam Updated Vital Signs BP 131/85 (BP Location: Right Arm)   Pulse 78   Temp 98.4 F (36.9 C) (Oral)   Resp 18   LMP 07/30/2016   SpO2 100%   Physical Exam  Constitutional: She appears well-developed and well-nourished.  Well appearing  HENT:  Head: Normocephalic and atraumatic.  Nose: Nose normal.  Mouth/Throat: Oropharynx is clear and moist.  No tenderness to temporal areas bilaterally.  Eyes: Conjunctivae and EOM are normal. Pupils are equal, round, and reactive to light.  Neck: Normal range of motion.  Normal range of motion of neck. No tenderness to palpation to neck. No nuchal rigidity noted.  Cardiovascular: Normal rate, normal heart sounds and intact distal pulses.   No murmur heard. Pulmonary/Chest: Effort normal and breath sounds normal. No respiratory distress. She has no wheezes. She has no rales.  Normal work of breathing. No respiratory distress noted.   Abdominal: Soft. There is no tenderness. There is no rebound and no guarding.  Musculoskeletal: Normal  range of motion.  Tenderness to palpation to cervical spine or trapezius muscles bilaterally.  Neurological: She is alert.  Cranial Nerves:  III,IV, VI: ptosis not present, extra-ocular movements intact bilaterally, direct and consensual pupillary light reflexes intact bilaterally V: facial sensation, jaw opening, and bite strength equal bilaterally VII: eyebrow raise, eyelid close, smile, frown, pucker equal  bilaterally VIII: hearing grossly normal bilaterally  IX,X: palate elevation and swallowing intact XI: bilateral shoulder shrug and lateral head rotation equal and strong XII: midline tongue extension  Negative pronator drift, negative Romberg, negative RAM's, negative heel-to-shin, negative finger to nose.    Sensory intact.  Muscle strength 5/5 Patient able to ambulate without difficulty.   Skin: Skin is warm. Capillary refill takes less than 2 seconds.  Psychiatric: She has a normal mood and affect. Her behavior is normal.  Nursing note and vitals reviewed.    ED Treatments / Results  Labs (all labs ordered are listed, but only abnormal results are displayed) Labs Reviewed  PREGNANCY, URINE    EKG  EKG Interpretation None       Radiology No results found.  Procedures Procedures (including critical care time)  Medications Ordered in ED Medications  ibuprofen (ADVIL,MOTRIN) tablet 600 mg (600 mg Oral Given 08/13/16 1538)     Initial Impression / Assessment and Plan / ED Course  I have reviewed the triage vital signs and the nursing notes.  Pertinent labs & imaging results that were available during my care of the patient were reviewed by me and considered in my medical decision making (see chart for details).     36 yo with headache similar to previous, no fever, neck stiffness, neuro findings or new sxs to suggest more serious etiology.  I don't think SAH, ICH, meningitis, encephalitis, mass at this time. No recent trauma. No tenderness to temporal areas bilaterally. I don't feel imaging necessary at this time. Pregnancy is negative. Plan to control symptoms. She is in no apparent distress, afebrile, hemodynamically stable.  Reassessed at 16:20 and improved headache. Able to stand and ambulate around the room w/o difficulty.  I feel safe for discharge. Discussed red flags and reasons to return immediately.  Plan to f/u with PCP in 3 days for recheck as  needed.   Final Clinical Impressions(s) / ED Diagnoses   Final diagnoses:  Acute nonintractable headache, unspecified headache type    New Prescriptions There are no discharge medications for this patient.    [address] [place], [place] 08/13/16 [address] [place], [place] 08/13/16 1706    Melene Plan, DO 08/13/16 1736

## 2016-10-11 ENCOUNTER — Encounter (HOSPITAL_BASED_OUTPATIENT_CLINIC_OR_DEPARTMENT_OTHER): Payer: Self-pay | Admitting: *Deleted

## 2016-10-11 ENCOUNTER — Emergency Department (HOSPITAL_BASED_OUTPATIENT_CLINIC_OR_DEPARTMENT_OTHER): Payer: Medicaid Other

## 2016-10-11 ENCOUNTER — Emergency Department (HOSPITAL_BASED_OUTPATIENT_CLINIC_OR_DEPARTMENT_OTHER)
Admission: EM | Admit: 2016-10-11 | Discharge: 2016-10-11 | Disposition: A | Discharge status: Home / Self Care | Payer: Medicaid Other | Attending: Emergency Medicine | Admitting: Emergency Medicine

## 2016-10-11 DIAGNOSIS — I10 Essential (primary) hypertension: Secondary | ICD-10-CM | POA: Insufficient documentation

## 2016-10-11 DIAGNOSIS — J45909 Unspecified asthma, uncomplicated: Secondary | ICD-10-CM | POA: Diagnosis not present

## 2016-10-11 DIAGNOSIS — N12 Tubulo-interstitial nephritis, not specified as acute or chronic: Secondary | ICD-10-CM

## 2016-10-11 DIAGNOSIS — R109 Unspecified abdominal pain: Secondary | ICD-10-CM

## 2016-10-11 DIAGNOSIS — R1031 Right lower quadrant pain: Secondary | ICD-10-CM | POA: Diagnosis present

## 2016-10-11 DIAGNOSIS — N200 Calculus of kidney: Secondary | ICD-10-CM | POA: Insufficient documentation

## 2016-10-11 DIAGNOSIS — R102 Pelvic and perineal pain: Secondary | ICD-10-CM

## 2016-10-11 LAB — COMPREHENSIVE METABOLIC PANEL
ALK PHOS: 48 U/L (ref 38–126)
ALT: 11 U/L — AB (ref 14–54)
AST: 14 U/L — AB (ref 15–41)
Albumin: 4 g/dL (ref 3.5–5.0)
Anion gap: 6 (ref 5–15)
BILIRUBIN TOTAL: 0.4 mg/dL (ref 0.3–1.2)
BUN: 10 mg/dL (ref 6–20)
CALCIUM: 8.8 mg/dL — AB (ref 8.9–10.3)
CO2: 27 mmol/L (ref 22–32)
CREATININE: 0.72 mg/dL (ref 0.44–1.00)
Chloride: 105 mmol/L (ref 101–111)
GFR calc Af Amer: >60 mL/min (ref >60)
Glucose, Bld: 86 mg/dL (ref 65–99)
Potassium: 3.8 mmol/L (ref 3.5–5.1)
Sodium: 138 mmol/L (ref 135–145)
TOTAL PROTEIN: 7.7 g/dL (ref 6.5–8.1)

## 2016-10-11 LAB — URINALYSIS, ROUTINE W REFLEX MICROSCOPIC

## 2016-10-11 LAB — URINALYSIS, MICROSCOPIC (REFLEX)

## 2016-10-11 LAB — CBC
HCT: 39.7 % (ref 36.0–46.0)
Hemoglobin: 12.4 g/dL (ref 12.0–15.0)
MCH: 25.4 pg — ABNORMAL LOW (ref 26.0–34.0)
MCHC: 31.2 g/dL (ref 30.0–36.0)
MCV: 81.4 fL (ref 78.0–100.0)
PLATELETS: 280 10*3/uL (ref 150–400)
RBC: 4.88 MIL/uL (ref 3.87–5.11)
RDW: 13.6 % (ref 11.5–15.5)
WBC: 5.3 10*3/uL (ref 4.0–10.5)

## 2016-10-11 LAB — LIPASE, BLOOD: Lipase: 18 U/L (ref 11–51)

## 2016-10-11 LAB — PREGNANCY, URINE: PREG TEST UR: NEGATIVE

## 2016-10-11 MED ORDER — CIPROFLOXACIN HCL 500 MG PO TABS
500.0000 mg | ORAL_TABLET | Freq: Once | ORAL | Status: AC
  Administered 2016-10-11: 500 mg via ORAL
  Filled 2016-10-11: qty 1

## 2016-10-11 MED ORDER — SODIUM CHLORIDE 0.9 % IV BOLUS (SEPSIS)
500.0000 mL | Freq: Once | INTRAVENOUS | Status: AC
  Administered 2016-10-11: 500 mL via INTRAVENOUS

## 2016-10-11 MED ORDER — CIPROFLOXACIN HCL 500 MG PO TABS: 500.0000 mg | ORAL_TABLET | Freq: Two times a day (BID) | ORAL | 0 refills | Status: AC

## 2016-10-11 MED ORDER — TAMSULOSIN HCL 0.4 MG PO CAPS
0.4000 mg | ORAL_CAPSULE | Freq: Once | ORAL | Status: AC
  Administered 2016-10-11: 0.4 mg via ORAL
  Filled 2016-10-11: qty 1

## 2016-10-11 MED ORDER — SODIUM CHLORIDE 0.9 % IV SOLN: Freq: Once | INTRAVENOUS | Status: DC

## 2016-10-11 MED ORDER — TRAMADOL HCL 50 MG PO TABS: 50.0000 mg | ORAL_TABLET | Freq: Four times a day (QID) | ORAL | 0 refills | Status: AC | PRN

## 2016-10-11 MED ORDER — KETOROLAC TROMETHAMINE 30 MG/ML IJ SOLN
30.0000 mg | Freq: Once | INTRAMUSCULAR | Status: AC
  Administered 2016-10-11: 30 mg via INTRAVENOUS
  Filled 2016-10-11: qty 1

## 2016-10-11 NOTE — ED Notes (Signed)
Patient transported to Ultrasound 

## 2016-10-11 NOTE — ED Notes (Signed)
ED Provider at bedside. 

## 2016-10-11 NOTE — Discharge Instructions (Signed)
Follow-up with your primary care physician, or [facility] if not improving.

## 2016-10-11 NOTE — ED Notes (Signed)
Patient transported to CT 

## 2016-10-11 NOTE — ED Triage Notes (Signed)
Abdominal pain in her right lower quadrant with radiation into her right leg.

## 2016-10-11 NOTE — ED Provider Notes (Addendum)
[facility] Provider Note   CSN: [id number] Arrival date & time: 10/11/16  1236     History   Chief Complaint Chief Complaint  Patient presents with  . Abdominal Pain    HPI Barbara Williams is a 36 y.o. female. CC: Right flank, and right lower quadrant pain  HPI 36 year old female. 2 days of right flank pain. Has migrated in the right lower quadrant. Somewhat into the right groin. Hurts to walk. Hurts sometimes move her leg. Started her menses yesterday. She thought this might have been the problem. Has not improved. No fever. No unusual vaginal discharge. Some dysuria.  Past Medical History:  Diagnosis Date  . Asthma   . Hypertension     There are no active problems to display for this patient.   History reviewed. No pertinent surgical history.  OB History    No data available       Home Medications    Prior to Admission medications   Medication Sig Start Date End Date Taking? Authorizing Provider  ciprofloxacin (CIPRO) 500 MG tablet Take 1 tablet (500 mg total) by mouth every 12 (twelve) hours. 10/11/16   Rolland PorterJames, Yaileen Hofferber, MD  traMADol (ULTRAM) 50 MG tablet Take 1 tablet (50 mg total) by mouth every 6 (six) hours as needed. 10/11/16   Rolland PorterJames, Khadija Thier, MD    Family History No family history on file.  Social History Social History  Substance Use Topics  . Smoking status: Never Smoker  . Smokeless tobacco: Never Used  . Alcohol use No     Allergies   Patient has no known allergies.   Review of Systems Review of Systems  Constitutional: Negative for appetite change, chills, diaphoresis, fatigue and fever.  HENT: Negative for mouth sores, sore throat and trouble swallowing.   Eyes: Negative for visual disturbance.  Respiratory: Negative for cough, chest tightness, shortness of breath and wheezing.   Cardiovascular: Negative for chest pain.  Gastrointestinal: Positive for abdominal pain. Negative for abdominal distention, diarrhea, nausea and vomiting.   Endocrine: Negative for polydipsia, polyphagia and polyuria.  Genitourinary: Positive for flank pain, menstrual problem and vaginal bleeding. Negative for dysuria, frequency, hematuria and vaginal discharge.  Musculoskeletal: Negative for gait problem.  Skin: Negative for color change, pallor and rash.  Neurological: Negative for dizziness, syncope, light-headedness and headaches.  Hematological: Does not bruise/bleed easily.  Psychiatric/Behavioral: Negative for behavioral problems and confusion.     Physical Exam Updated Vital Signs BP 117/88 (BP Location: Right Arm)   Pulse 66   Temp 98.5 F (36.9 C) (Oral)   Resp 18   Ht 5' (1.524 m)   Wt 79.8 kg (176 lb)   LMP 10/09/2016   SpO2 100%   BMI 34.37 kg/m   Physical Exam  Constitutional: She is oriented to person, place, and time. She appears well-developed and well-nourished. No distress.  HENT:  Head: Normocephalic.  Eyes: Conjunctivae are normal. Pupils are equal, round, and reactive to light. No scleral icterus.  Neck: Normal range of motion. Neck supple. No thyromegaly present.  Cardiovascular: Normal rate and regular rhythm.  Exam reveals no gallop and no friction rub.   No murmur heard. Pulmonary/Chest: Effort normal and breath sounds normal. No respiratory distress. She has no wheezes. She has no rales.  Abdominal: Soft. Bowel sounds are normal. She exhibits no distension. There is no tenderness. There is no rebound.  Tenderness and right lower quadrant and suprapubic abdomen.  Musculoskeletal: Normal range of motion.  Neurological: She is alert  and oriented to person, place, and time.  Skin: Skin is warm and dry. No rash noted.  Psychiatric: She has a normal mood and affect. Her behavior is normal.     ED Treatments / Results  Labs (all labs ordered are listed, but only abnormal results are displayed) Labs Reviewed  URINALYSIS, ROUTINE W REFLEX MICROSCOPIC - Abnormal; Notable for the following:       Result  Value   Color, Urine RED (*)    APPearance HAZY (*)    Glucose, UA   (*)    Value: TEST NOT REPORTED DUE TO COLOR INTERFERENCE OF URINE PIGMENT   Hgb urine dipstick   (*)    Value: TEST NOT REPORTED DUE TO COLOR INTERFERENCE OF URINE PIGMENT   Bilirubin Urine   (*)    Value: TEST NOT REPORTED DUE TO COLOR INTERFERENCE OF URINE PIGMENT   Ketones, ur   (*)    Value: TEST NOT REPORTED DUE TO COLOR INTERFERENCE OF URINE PIGMENT   Protein, ur   (*)    Value: TEST NOT REPORTED DUE TO COLOR INTERFERENCE OF URINE PIGMENT   Nitrite   (*)    Value: TEST NOT REPORTED DUE TO COLOR INTERFERENCE OF URINE PIGMENT   Leukocytes, UA   (*)    Value: TEST NOT REPORTED DUE TO COLOR INTERFERENCE OF URINE PIGMENT   All other components within normal limits  URINALYSIS, MICROSCOPIC (REFLEX) - Abnormal; Notable for the following:    Bacteria, UA MANY (*)    Squamous Epithelial / LPF 6-30 (*)    All other components within normal limits  COMPREHENSIVE METABOLIC PANEL - Abnormal; Notable for the following:    Calcium 8.8 (*)    AST 14 (*)    ALT 11 (*)    All other components within normal limits  CBC - Abnormal; Notable for the following:    MCH 25.4 (*)    All other components within normal limits  PREGNANCY, URINE  LIPASE, BLOOD    EKG  EKG Interpretation None       Radiology US Transvaginal Non-ob  Result Date: 10/11/2016 CLINICAL DATA:  Right pelvic pain for 5 days. EXAM: TRANSABDOMINAL AND TRANSVAGINAL ULTRASOUND OF PELVIS TECHNIQUE: Both transabdominal and transvaginal ultrasound examinations of the pelvis were performed. Transabdominal technique was performed for global imaging of the pelvis including uterus, ovaries, adnexal regions, and pelvic cul-de-sac. It was necessary to proceed with endovaginal exam following the transabdominal exam to visualize the endometrium and ovaries. COMPARISON:  None FINDINGS: Uterus Measurements: 8.7 x 4.5 x 5.9 cm. No fibroids or other mass visualized.  Endometrium Thickness: 3.4 mm.  No focal abnormality visualized. Right ovary Measurements: 2 x 1 x 1.5 cm. Incompletely visualized but grossly unremarkable. Doppler flow was not confirmed in the right ovary. However, this is probably technical due to poor visualization as torsion is very unusual and a normal-sized right ovary. Left ovary Measurements: 3.2 x 1.5 x 2.7 cm. Normal appearance/no adnexal mass. Other findings No abnormal free fluid. IMPRESSION: 1. The right ovary was not well visualized. However, visualized portions are normal and there is no evidence of a right ovarian mass or enlargement. Doppler blood flow was not confirmed in the right ovary. However, this is most likely technical in nature due to the difficult visualization. Torsion would be very unusual in the absence of ovarian enlargement or mass. Dedicated Doppler imaging could be attempted if clinically warranted. 2. No other abnormalities. Electronically Signed   By: Onalee Hua  Judithe Modest M.D   On: 10/11/2016 18:49   US Pelvis Complete  Result Date: 10/11/2016 CLINICAL DATA:  Right pelvic pain for 5 days. EXAM: TRANSABDOMINAL AND TRANSVAGINAL ULTRASOUND OF PELVIS TECHNIQUE: Both transabdominal and transvaginal ultrasound examinations of the pelvis were performed. Transabdominal technique was performed for global imaging of the pelvis including uterus, ovaries, adnexal regions, and pelvic cul-de-sac. It was necessary to proceed with endovaginal exam following the transabdominal exam to visualize the endometrium and ovaries. COMPARISON:  None FINDINGS: Uterus Measurements: 8.7 x 4.5 x 5.9 cm. No fibroids or other mass visualized. Endometrium Thickness: 3.4 mm.  No focal abnormality visualized. Right ovary Measurements: 2 x 1 x 1.5 cm. Incompletely visualized but grossly unremarkable. Doppler flow was not confirmed in the right ovary. However, this is probably technical due to poor visualization as torsion is very unusual and a normal-sized  right ovary. Left ovary Measurements: 3.2 x 1.5 x 2.7 cm. Normal appearance/no adnexal mass. Other findings No abnormal free fluid. IMPRESSION: 1. The right ovary was not well visualized. However, visualized portions are normal and there is no evidence of a right ovarian mass or enlargement. Doppler blood flow was not confirmed in the right ovary. However, this is most likely technical in nature due to the difficult visualization. Torsion would be very unusual in the absence of ovarian enlargement or mass. Dedicated Doppler imaging could be attempted if clinically warranted. 2. No other abnormalities. Electronically Signed   By: Gerome Sam III M.D   On: 10/11/2016 18:49   Ct Renal Stone Study  Result Date: 10/11/2016 CLINICAL DATA:  Right flank and lower abdominal pain EXAM: CT ABDOMEN AND PELVIS WITHOUT CONTRAST TECHNIQUE: Multidetector CT imaging of the abdomen and pelvis was performed following the standard protocol without IV contrast. COMPARISON:  None. FINDINGS: Lower chest: No acute abnormality. Hepatobiliary: No focal liver abnormality is seen. No gallstones, gallbladder wall thickening, or biliary dilatation. Pancreas: Unremarkable. No pancreatic ductal dilatation or surrounding inflammatory changes. Spleen: Normal in size without focal abnormality. Adrenals/Urinary Tract: Adrenal glands are unremarkable. Kidneys are normal, without renal calculi, focal lesion, or hydronephrosis. Bladder is unremarkable. Stomach/Bowel: Stomach is within normal limits. Appendix appears normal. No evidence of bowel wall thickening, distention, or inflammatory changes. Vascular/Lymphatic: No significant vascular findings are present. No enlarged abdominal or pelvic lymph nodes. Reproductive: Uterus and bilateral adnexa are unremarkable. Other: No abdominal wall hernia or abnormality. No abdominopelvic ascites. Musculoskeletal: No acute osseous finding IMPRESSION: No acute obstructing urinary tract or ureteral  calculus. No hydronephrosis or acute obstructive uropathy. Normal appendix No acute intra-abdominal or pelvic finding by noncontrast CT. Electronically Signed   By: Judie Petit.  Shick M.D.   On: 10/11/2016 16:18    Procedures Procedures (including critical care time)  Medications Ordered in ED Medications  0.9 %  sodium chloride infusion ( Intravenous Not Given 10/11/16 1712)  ciprofloxacin (CIPRO) tablet 500 mg (not administered)  sodium chloride 0.9 % bolus 500 mL (0 mLs Intravenous Stopped 10/11/16 1711)  ketorolac (TORADOL) 30 MG/ML injection 30 mg (30 mg Intravenous Given 10/11/16 1557)  tamsulosin (FLOMAX) capsule 0.4 mg (0.4 mg Oral Given 10/11/16 1557)     Initial Impression / Assessment and Plan / ED Course  I have reviewed the triage vital signs and the nursing notes.  Pertinent labs & imaging results that were available during my care of the patient were reviewed by me and considered in my medical decision making (see chart for details).   not an exam consistent with appendicitis. Urine  shows blood and white blood cells. Although not taking Azo for any other medications dip stick was indeterminate. Culture requested. CT she does not show appendicitis or obvious abnormalities. Pelvic ultrasound shows normal ovaries. Could not confirm Doppler flow of right ovary but normal size.  Pain not sudden at onset as I would expect with torsion. Symptomatically for more than 48 hours. Plan treatment for UTI culture, antibiotics, pain medication. GYN, primary care, or community health center follow-up if not improving.  Final Clinical Impressions(s) / ED Diagnoses   Final diagnoses:  Abdominal pain, unspecified abdominal location  Flank pain  Pyelonephritis    New Prescriptions New Prescriptions   CIPROFLOXACIN (CIPRO) 500 MG TABLET    Take 1 tablet (500 mg total) by mouth every 12 (twelve) hours.   TRAMADOL (ULTRAM) 50 MG TABLET    Take 1 tablet (50 mg total) by mouth every 6 (six) hours as  needed.     Rolland Porter, MD 10/11/16 1901    Rolland Porter, MD 10/11/16 Mikle Bosworth

## 2017-07-03 ENCOUNTER — Encounter (HOSPITAL_BASED_OUTPATIENT_CLINIC_OR_DEPARTMENT_OTHER): Payer: Self-pay | Admitting: *Deleted

## 2017-07-03 ENCOUNTER — Emergency Department (HOSPITAL_BASED_OUTPATIENT_CLINIC_OR_DEPARTMENT_OTHER)
Admission: EM | Admit: 2017-07-03 | Discharge: 2017-07-03 | Disposition: A | Discharge status: Home / Self Care | Payer: Medicaid Other | Attending: Emergency Medicine | Admitting: Emergency Medicine

## 2017-07-03 ENCOUNTER — Other Ambulatory Visit: Payer: Self-pay

## 2017-07-03 DIAGNOSIS — Z79899 Other long term (current) drug therapy: Secondary | ICD-10-CM | POA: Diagnosis not present

## 2017-07-03 DIAGNOSIS — M79641 Pain in right hand: Secondary | ICD-10-CM | POA: Diagnosis present

## 2017-07-03 DIAGNOSIS — I10 Essential (primary) hypertension: Secondary | ICD-10-CM | POA: Diagnosis not present

## 2017-07-03 DIAGNOSIS — L03011 Cellulitis of right finger: Secondary | ICD-10-CM | POA: Diagnosis not present

## 2017-07-03 DIAGNOSIS — J45909 Unspecified asthma, uncomplicated: Secondary | ICD-10-CM | POA: Diagnosis not present

## 2017-07-03 LAB — PREGNANCY, URINE: PREG TEST UR: NEGATIVE

## 2017-07-03 MED ORDER — LIDOCAINE HCL (PF) 1 % IJ SOLN
10.0000 mL | Freq: Once | INTRAMUSCULAR | Status: AC
  Administered 2017-07-03: 10 mL
  Filled 2017-07-03 (×2): qty 10

## 2017-07-03 MED ORDER — SULFAMETHOXAZOLE-TRIMETHOPRIM 800-160 MG PO TABS: 1.0000 | ORAL_TABLET | Freq: Two times a day (BID) | ORAL | 0 refills | Status: AC

## 2017-07-03 MED ORDER — BACITRACIN ZINC 500 UNIT/GM EX OINT: 1.0000 "application " | TOPICAL_OINTMENT | Freq: Two times a day (BID) | CUTANEOUS | 0 refills | Status: AC

## 2017-07-03 MED FILL — SULFAMETHOXAZOLE-TMP DS TAB: 800-160 | 5 days supply | Qty: 10 | Fill #0

## 2017-07-03 NOTE — Discharge Instructions (Signed)
You have been treated for an abscess in the ED.  ° °There are sign of surrounding infection. Please take all of your antibiotics until finished!   You may develop abdominal discomfort or diarrhea from the antibiotic. You may help offset this with probiotics which you can buy or get in yogurt.  ° °May take tylenol and motrin as needed for pain. Warm compress to the area to help with the healing process. Avoid swimming for 1 week. May soak in warm water to help with pain and the healing process.  ° °Follow up with your doctor, an urgent care, or return to ED in order to remove your packing in 48-72 hours. If you do not have packing return in 48-72 hours for wound recheck. Return to the emergency department if you develop a fever, your abscess appears to become more infected (growing surrounding redness and warmth), new or worsening symptoms develop, any additional concerns.  ° °Abscess °An abscess (boil or furuncle) is an infected area that contains a collection of pus.  ° °SYMPTOMS °Signs and symptoms of an abscess include pain, tenderness, redness, or hardness. You may feel a moveable soft area under your skin. An abscess can occur anywhere in the body.  ° °TREATMENT  °A surgical cut (incision) may be made over your abscess to drain the pus. Gauze may be packed into the space or a drain may be looped through the abscess cavity (pocket). This provides a drain that will allow the cavity to heal from the inside outwards. The abscess may be painful for a few days, but should feel much better if it was drained.  °Your abscess, if seen early, may not have localized and may not have been drained. If not, another appointment may be required if it does not get better on its own or with medications. ° °HOME CARE INSTRUCTIONS  °Keep the skin and clothes clean around your abscess.  °If the abscess was drained, you will need to use gauze dressing to collect any draining pus. Dressings will typically need to be changed 3 or more  times a day.  °The infection may spread by skin contact with others. Avoid skin contact as much as possible.  °Practice good hygiene. This includes regular hand washing, cover any draining skin lesions, and do not share personal care items.  °SEEK MEDICAL CARE IF:  °You develop increased pain, swelling, redness, drainage, or bleeding in the wound site.  °You develop signs of generalized infection including muscle aches, chills, fever, or a general ill feeling.  °You have an oral temperature above 102° F (38.9° C).  °MAKE SURE YOU:  °Understand these instructions.  °Will watch your condition.  °Will get help right away if you are not doing well or get worse.  °Document Released: 01/10/2005 Document Revised: 12/13/2010 Document Reviewed: 11/04/2007 °[employer]® Patient Information ©2012 [employer]. ° ° ° °

## 2017-07-03 NOTE — ED Triage Notes (Signed)
Pt pulled a hang nail off of her right ring finger and it is now yellow, swollen and very painful.

## 2017-07-03 NOTE — ED Provider Notes (Signed)
[facility] EMERGENCY DEPARTMENT Provider Note   CSN: [id number] Arrival date & time: 07/03/17  9562     History   Chief Complaint Chief Complaint  Patient presents with  . Hand Pain    HPI Barbara Williams is a 36 y.o. female.  HPI 37 year old African American female with past medical history significant for hypertension presents to the emergency department today with complaints of infection to her nail.  Patient states that several days ago she pulled the skin off of her right ring finger nail.  Patient states that after that she started noticing a yellow spot that became swollen and very painful with erythema.  Patient states this is gradually worsened over the past 2 days.  Reports pain with palpation.  She does have range of motion that does not cause her pain.  Patient has been trying over-the-counter antibiotic spray at home without any relief.  She has not taking for the pain prior to arrival.  Denies any associated fevers, chills, nausea, vomiting. Past Medical History:  Diagnosis Date  . Asthma   . Hypertension     There are no active problems to display for this patient.   History reviewed. No pertinent surgical history.  OB History    No data available       Home Medications    Prior to Admission medications   Medication Sig Start Date End Date Taking? Authorizing Provider  hydrochlorothiazide (HYDRODIURIL) 12.5 MG tablet Take 12.5 mg by mouth daily.   Yes [provider]  topiramate (TOPAMAX) 100 MG tablet Take 100 mg by mouth daily.   Yes [provider]  ciprofloxacin (CIPRO) 500 MG tablet Take 1 tablet (500 mg total) by mouth every 12 (twelve) hours. 10/11/16   Rolland Porter, MD  sulfamethoxazole-trimethoprim (BACTRIM DS,SEPTRA DS) 800-160 MG tablet Take 1 tablet by mouth 2 (two) times daily. 07/03/17   Rise Mu, PA-C  traMADol (ULTRAM) 50 MG tablet Take 1 tablet (50 mg total) by mouth every 6 (six) hours as needed.  10/11/16   Rolland Porter, MD    Family History History reviewed. No pertinent family history.  Social History Social History   Tobacco Use  . Smoking status: Never Smoker  . Smokeless tobacco: Never Used  Substance Use Topics  . Alcohol use: No  . Drug use: Not on file     Allergies   Patient has no known allergies.   Review of Systems Review of Systems  All other systems reviewed and are negative.    Physical Exam Updated Vital Signs BP (!) 136/100 (BP Location: Right Arm)   Pulse 72   Temp 98 F (36.7 C) (Oral)   Resp 18   Ht 5' (1.524 m)   Wt 81.6 kg (180 lb)   LMP 05/19/2017 (Approximate)   SpO2 100%   BMI 35.15 kg/m   Physical Exam  Constitutional: She appears well-developed and well-nourished. No distress.  HENT:  Head: Normocephalic and atraumatic.  Eyes: Right eye exhibits no discharge. Left eye exhibits no discharge. No scleral icterus.  Neck: Normal range of motion.  Pulmonary/Chest: No respiratory distress.  Musculoskeletal: Normal range of motion.  Patient with full range of motion of the DIP and PIP of the ring finger of the right hand without any erythema of the joints or pain with range of motion.  Neurological: She is alert.  Skin: Skin is warm and dry. Capillary refill takes less than 2 seconds. No pallor.  Patient has erythema, edema and  yellow discoloration of the radial aspect of the ring finger next to the nail.  This extends to the cuticle.  There is no drainage noted.  Tender to palpation.  No associated warmth. The finger tip pad is without necorisis or sign erythema or swelling. Swelling localized to the cuticle.  Skin compartments are soft.  Psychiatric: Her behavior is normal. Judgment and thought content normal.  Nursing note and vitals reviewed.    ED Treatments / Results  Labs (all labs ordered are listed, but only abnormal results are displayed) Labs Reviewed  PREGNANCY, URINE    EKG  EKG Interpretation None        Radiology No results found.  Procedures .Marland Kitchen.Incision and Drainage Date/Time: 07/03/2017 10:45 AM Performed by: Rise MuLeaphart, Kenneth T, PA-C Authorized by: Rise MuLeaphart, Kenneth T, PA-C   Consent:    Consent obtained:  Verbal   Consent given by:  Patient   Risks discussed:  Bleeding, damage to other organs, infection, incomplete drainage and pain   Alternatives discussed:  No treatment Location:    Type:  Abscess (paronychia)   Location:  Upper extremity   Upper extremity location:  Finger   Finger location:  R ring finger Pre-procedure details:    Skin preparation:  Betadine Anesthesia (see MAR for exact dosages):    Anesthesia method:  Nerve block   Block needle gauge:  27 G   Block anesthetic:  Lidocaine 1% w/o epi (6cc)   Block technique:  Dorsal block   Block injection procedure:  Anatomic landmarks identified, introduced needle, incremental injection, negative aspiration for blood and anatomic landmarks palpated   Block outcome:  Anesthesia achieved Procedure type:    Complexity:  Simple Procedure details:    Incision types:  Stab incision   Incision depth:  Dermal   Scalpel blade:  15   Drainage:  Bloody and purulent   Drainage amount:  Moderate   Wound treatment:  Wound left open   Packing materials:  None Post-procedure details:    Patient tolerance of procedure:  Tolerated well, no immediate complications   (including critical care time)  Medications Ordered in ED Medications  lidocaine (PF) (XYLOCAINE) 1 % injection 10 mL (10 mLs Infiltration Given 07/03/17 1022)     Initial Impression / Assessment and Plan / ED Course  I have reviewed the triage vital signs and the nursing notes.  Pertinent labs & imaging results that were available during my care of the patient were reviewed by me and considered in my medical decision making (see chart for details).     Patient with skin abscess amenable to incision and drainage.  Sensation consistent with paronychia.   Low suspicion for failing at this time.  Abscess was not large enough to warrant packing or drain,  wound recheck in 2 days. Encouraged home warm soaks and flushing.  Given the location with surrounding erythema will treat with Bactrim and topical antibiotic ointment.  Pt is hemodynamically stable, in NAD, & able to ambulate in the ED. Evaluation does not show pathology that would require ongoing emergent intervention or inpatient treatment. I explained the diagnosis to the patient. Pain has been managed & has no complaints prior to dc. Pt is comfortable with above plan and is stable for discharge at this time. All questions were answered prior to disposition. Strict return precautions for f/u to the ED were discussed. Encouraged follow up with PCP.    Final Clinical Impressions(s) / ED Diagnoses   Final diagnoses:  Paronychia of finger  of right hand    ED Discharge Orders        Ordered    sulfamethoxazole-trimethoprim (BACTRIM DS,SEPTRA DS) 800-160 MG tablet  2 times daily     07/03/17 1035       Rise Mu, PA-C 07/03/17 1049    Vanetta Mulders, MD 07/05/17 1524

## 2017-07-03 NOTE — ED Notes (Signed)
NAD at this time. Pt is stable and going home.  

## 2017-08-28 ENCOUNTER — Emergency Department (HOSPITAL_BASED_OUTPATIENT_CLINIC_OR_DEPARTMENT_OTHER)
Admission: EM | Admit: 2017-08-28 | Discharge: 2017-08-28 | Disposition: A | Discharge status: Home / Self Care | Payer: Medicaid Other | Attending: Emergency Medicine | Admitting: Emergency Medicine

## 2017-08-28 ENCOUNTER — Encounter (HOSPITAL_BASED_OUTPATIENT_CLINIC_OR_DEPARTMENT_OTHER): Payer: Self-pay | Admitting: Emergency Medicine

## 2017-08-28 ENCOUNTER — Other Ambulatory Visit: Payer: Self-pay

## 2017-08-28 DIAGNOSIS — R519 Headache, unspecified: Secondary | ICD-10-CM

## 2017-08-28 DIAGNOSIS — J309 Allergic rhinitis, unspecified: Secondary | ICD-10-CM | POA: Diagnosis not present

## 2017-08-28 DIAGNOSIS — I10 Essential (primary) hypertension: Secondary | ICD-10-CM | POA: Insufficient documentation

## 2017-08-28 DIAGNOSIS — R51 Headache: Secondary | ICD-10-CM | POA: Diagnosis not present

## 2017-08-28 DIAGNOSIS — J45909 Unspecified asthma, uncomplicated: Secondary | ICD-10-CM | POA: Insufficient documentation

## 2017-08-28 DIAGNOSIS — Z79899 Other long term (current) drug therapy: Secondary | ICD-10-CM | POA: Diagnosis not present

## 2017-08-28 MED ORDER — DIPHENHYDRAMINE HCL 50 MG/ML IJ SOLN
25.0000 mg | Freq: Once | INTRAMUSCULAR | Status: AC
  Administered 2017-08-28: 25 mg via INTRAVENOUS
  Filled 2017-08-28: qty 1

## 2017-08-28 MED ORDER — KETOROLAC TROMETHAMINE 30 MG/ML IJ SOLN
30.0000 mg | Freq: Once | INTRAMUSCULAR | Status: AC
Start: 2017-08-28 — End: 2017-08-28
  Administered 2017-08-28: 30 mg via INTRAVENOUS
  Filled 2017-08-28: qty 1

## 2017-08-28 MED ORDER — METOCLOPRAMIDE HCL 5 MG/ML IJ SOLN
10.0000 mg | Freq: Once | INTRAMUSCULAR | Status: AC
  Administered 2017-08-28: 10 mg via INTRAVENOUS
  Filled 2017-08-28: qty 2

## 2017-08-28 MED ORDER — SODIUM CHLORIDE 0.9 % IV BOLUS
1000.0000 mL | Freq: Once | INTRAVENOUS | Status: AC
  Administered 2017-08-28: 1000 mL via INTRAVENOUS

## 2017-08-28 NOTE — Discharge Instructions (Signed)
Your evaluation today is reassuring.  You may use Tylenol, Aleve or Excedrin as needed for headaches.  Please follow-up with your regular doctor regarding this, as you may benefit from a change in your prophylactic medication.  Please begin taking Allegra daily and add Flonase to your allergy regimen to help with nasal congestion.  Return to the emergency department if any of the below scenarios develop.  Get help right away if: Your headache becomes really bad. You keep throwing up. You have a stiff neck. You have trouble seeing. You have trouble speaking. You have pain in the eye or ear. Your muscles are weak or you lose muscle control. You lose your balance or have trouble walking. You feel like you will pass out (faint) or you pass out. You have confusion.

## 2017-08-28 NOTE — ED Notes (Signed)
Feeling better,, states ," I am ready to go"

## 2017-08-28 NOTE — ED Triage Notes (Signed)
States her allergies have been bothering her and now she has had a h/a since yesterday. Took Allegra yesterday, denies n/v

## 2017-08-28 NOTE — ED Provider Notes (Signed)
[facility] EMERGENCY DEPARTMENT Provider Note   CSN: [id number] Arrival date & time: 08/28/17  4098     History   Chief Complaint Chief Complaint  Patient presents with  . Migraine    allergies     HPI Barbara Williams is a 37 y.o. female.  Barbara Williams is a 37 y.o. Female with history of asthma, hypertension and headaches, who presents to the emergency department for evaluation of left-sided headache that began yesterday.  Patient reports pain starts behind her left eye and extends back across the left side of her head.  Patient reports she has a history of very similar headaches, and this feels no different.  Patient reports she is on Topamax for headache prevention and takes over-the-counter medications when she does get headaches, but reports this 1 has not improved.  She is tried Tylenol and Aleve in addition to her daily Topamax.  Patient reports she has had to come to the emergency department for headaches many times in the past.  Headache was not maximum intensity at onset.  She denies any associated vision changes, just reports some light sensitivity, no dizziness, no nausea or vomiting.  No weakness, numbness or tingling in any of her extremities.  Patient reports her allergies have been getting progressively worse which she thinks probably triggered this headache and she does have some sinus pressure discomfort, she took 1 dose of Allegra yesterday, has not taken anything else to help with her sinus congestion.  She denies any fevers or chills, no neck pain or stiffness.  No head trauma or injury preceding headache.      Past Medical History:  Diagnosis Date  . Asthma   . Hypertension     There are no active problems to display for this patient.   History reviewed. No pertinent surgical history.   OB History   None      Home Medications    Prior to Admission medications   Medication Sig Start Date End Date Taking? Authorizing Provider  bacitracin  ointment Apply 1 application topically 2 (two) times daily. 07/03/17   Rise Mu, PA-C  ciprofloxacin (CIPRO) 500 MG tablet Take 1 tablet (500 mg total) by mouth every 12 (twelve) hours. 10/11/16   Rolland Porter, MD  hydrochlorothiazide (HYDRODIURIL) 12.5 MG tablet Take 12.5 mg by mouth daily.    [provider]  sulfamethoxazole-trimethoprim (BACTRIM DS,SEPTRA DS) 800-160 MG tablet Take 1 tablet by mouth 2 (two) times daily. 07/03/17   Rise Mu, PA-C  topiramate (TOPAMAX) 100 MG tablet Take 100 mg by mouth daily.    [provider]  traMADol (ULTRAM) 50 MG tablet Take 1 tablet (50 mg total) by mouth every 6 (six) hours as needed. 10/11/16   Rolland Porter, MD    Family History No family history on file.  Social History Social History   Tobacco Use  . Smoking status: Never Smoker  . Smokeless tobacco: Never Used  Substance Use Topics  . Alcohol use: No  . Drug use: Never     Allergies   Patient has no known allergies.   Review of Systems Review of Systems  Constitutional: Negative for chills and fever.  HENT: Positive for congestion, postnasal drip, rhinorrhea, sinus pressure and sneezing. Negative for ear discharge, ear pain and sore throat.   Eyes: Positive for photophobia. Negative for pain, redness and visual disturbance.  Respiratory: Negative for cough and shortness of breath.   Cardiovascular: Negative for chest pain.  Gastrointestinal: Negative  for abdominal pain, nausea and vomiting.  Genitourinary: Negative for dysuria and frequency.  Musculoskeletal: Negative for arthralgias, back pain, myalgias, neck pain and neck stiffness.  Skin: Negative for color change and rash.  Neurological: Positive for headaches. Negative for dizziness, tremors, facial asymmetry, speech difficulty, weakness, light-headedness and numbness.     Physical Exam Updated Vital Signs BP (!) 145/91 (BP Location: Right Arm)   Pulse 92   Temp 98.2 F (36.8 C)  (Oral)   Resp 18   SpO2 100%   Physical Exam  Constitutional: She is oriented to person, place, and time. She appears well-developed and well-nourished. No distress.  HENT:  Head: Normocephalic and atraumatic.  TMs clear with good landmarks, moderate nasal mucosa edema with clear rhinorrhea, posterior oropharynx clear and moist, with no erythema, edema or exudates  Eyes: Right eye exhibits no discharge. Left eye exhibits no discharge.  Neck: Normal range of motion. Neck supple.  No rigidity  Cardiovascular: Normal rate, regular rhythm, normal heart sounds and intact distal pulses.  Pulmonary/Chest: Effort normal and breath sounds normal. No stridor. No respiratory distress. She has no wheezes. She has no rales.  Respirations equal and unlabored, patient able to speak in full sentences, lungs clear to auscultation bilaterally  Abdominal: Soft. Bowel sounds are normal.  Musculoskeletal: She exhibits no edema or deformity.  Neurological: She is alert and oriented to person, place, and time. Coordination normal.  Speech is clear, able to follow commands CN III-XII intact Normal strength in upper and lower extremities bilaterally including dorsiflexion and plantar flexion, strong and equal grip strength Sensation normal to light and sharp touch Moves extremities without ataxia, coordination intact Normal finger to nose and rapid alternating movements No pronator drift  Skin: Skin is warm and dry. Capillary refill takes less than 2 seconds. She is not diaphoretic.  Psychiatric: She has a normal mood and affect. Her behavior is normal.  Nursing note and vitals reviewed.    ED Treatments / Results  Labs (all labs ordered are listed, but only abnormal results are displayed) Labs Reviewed - No data to display  EKG None  Radiology No results found.  Procedures Procedures (including critical care time)  Medications Ordered in ED Medications  sodium chloride 0.9 % bolus 1,000 mL (0  mLs Intravenous Stopped 08/28/17 1119)  ketorolac (TORADOL) 30 MG/ML injection 30 mg (30 mg Intravenous Given 08/28/17 1020)  metoCLOPramide (REGLAN) injection 10 mg (10 mg Intravenous Given 08/28/17 1021)  diphenhydrAMINE (BENADRYL) injection 25 mg (25 mg Intravenous Given 08/28/17 1022)     Initial Impression / Assessment and Plan / ED Course  I have reviewed the triage vital signs and the nursing notes.  Pertinent labs & imaging results that were available during my care of the patient were reviewed by me and considered in my medical decision making (see chart for details).  Pt HA treated and improved while in ED.  Presentation is like pts typical HA and non concerning for [facility], [facility], Meningitis, or temporal arteritis. Pt is afebrile with no focal neuro deficits, nuchal rigidity, or change in vision. Pt is to follow up with PCP to discuss changing her prophylactic medication.  Patient does have some signs of allergic rhinitis, recommend she take Allegra regularly as well as Flonase.  Patient to follow-up with her primary care doctor pt verbalizes understanding and is agreeable with plan to dc.    Final Clinical Impressions(s) / ED Diagnoses   Final diagnoses:  Bad headache  Allergic rhinitis, unspecified seasonality,  unspecified trigger    ED Discharge Orders    None       Dartha Lodge, [place] 08/28/17 1125    [name], MD 08/28/17 [phone]

## 2017-08-28 NOTE — ED Notes (Signed)
ED Provider at bedside. 

## 2019-02-26 IMAGING — US US TRANSVAGINAL NON-OB
1 series · 13 of 25 positions shown · non-contrast
Comparison: None

CLINICAL DATA: Right pelvic pain for 5 days.

EXAM:
TRANSABDOMINAL AND TRANSVAGINAL ULTRASOUND OF PELVIS
TECHNIQUE: Both transabdominal and transvaginal ultrasound examinations of the
pelvis were performed. Transabdominal technique was performed for
global imaging of the pelvis including uterus, ovaries, adnexal
regions, and pelvic cul-de-sac. It was necessary to proceed with
endovaginal exam following the transabdominal exam to visualize the
endometrium and ovaries.

[Series 1: us transvaginal non-ob · 0.22mm/px · 13 of 45 slices shown]
[im 1/45]
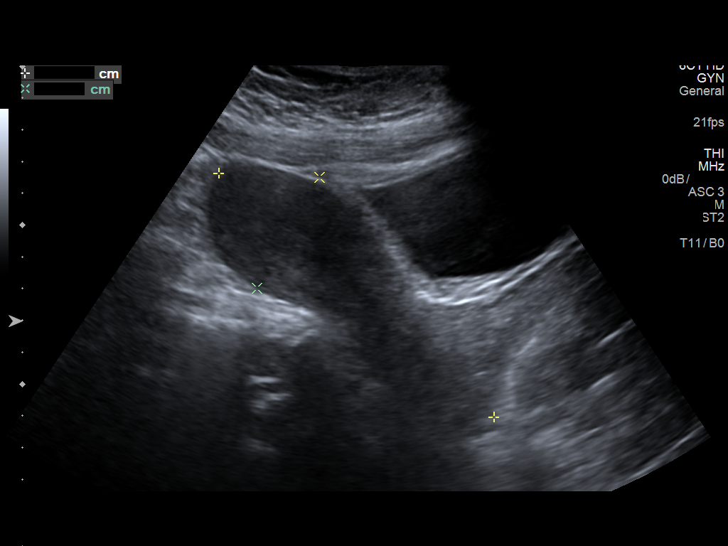
[im 4/45]
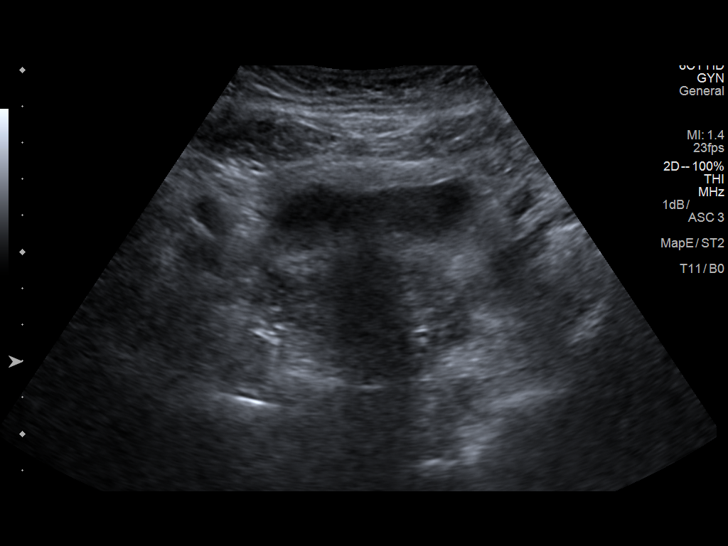
[im 8/45]
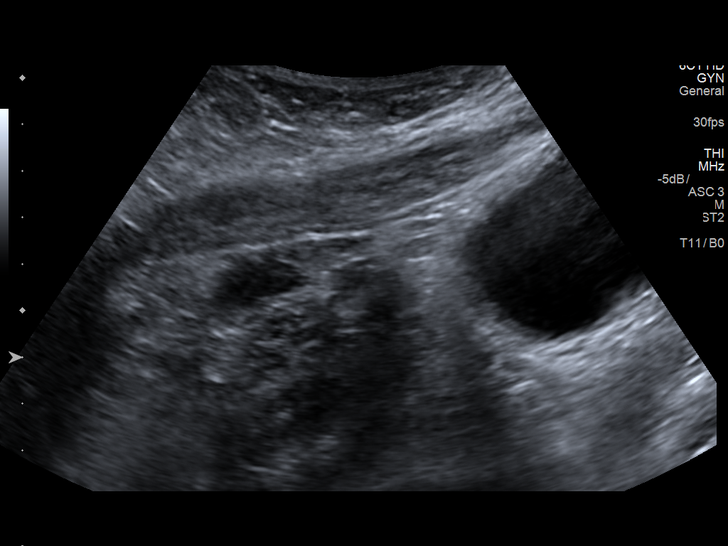
[im 12/45]
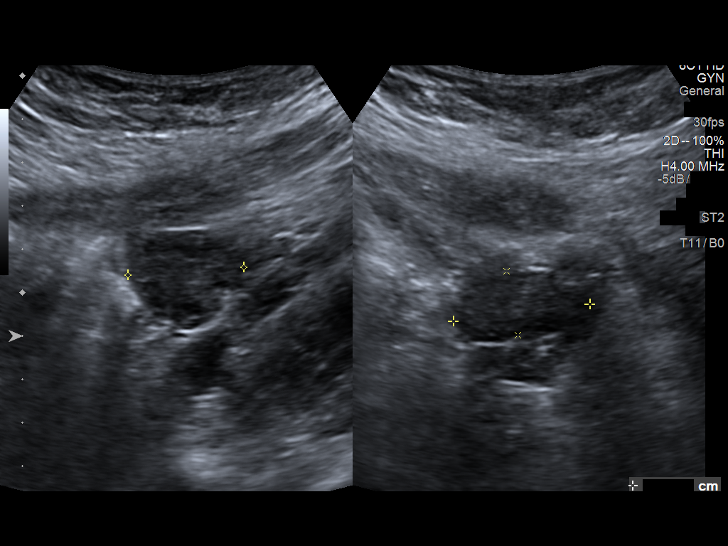
[im 15/45]
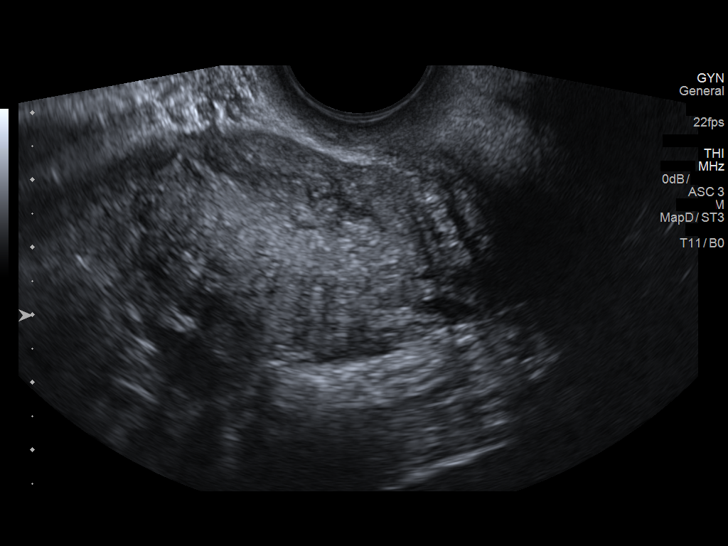
[im 19/45]
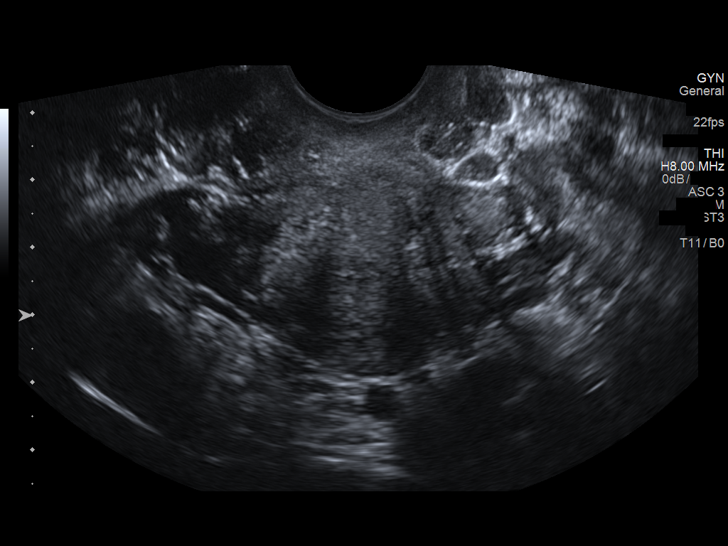
[im 23/45]
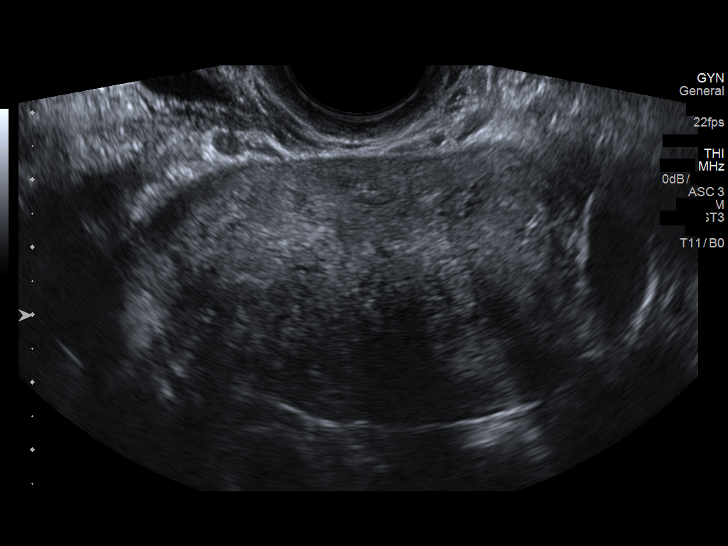
[im 26/45]
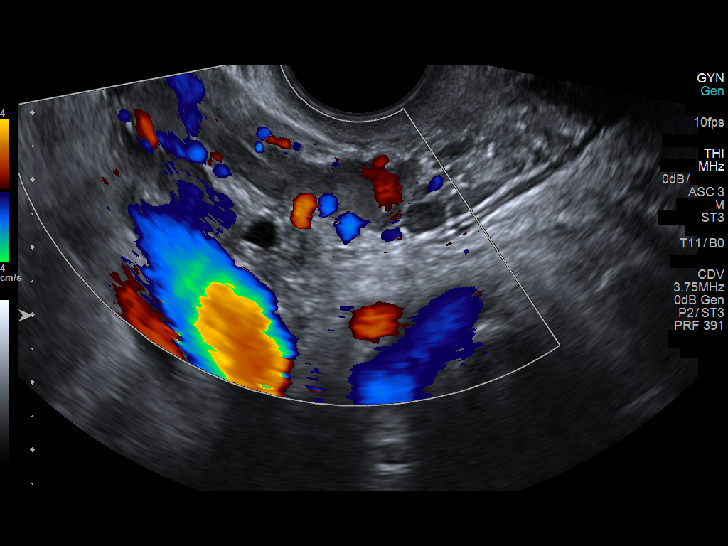
[im 30/45]
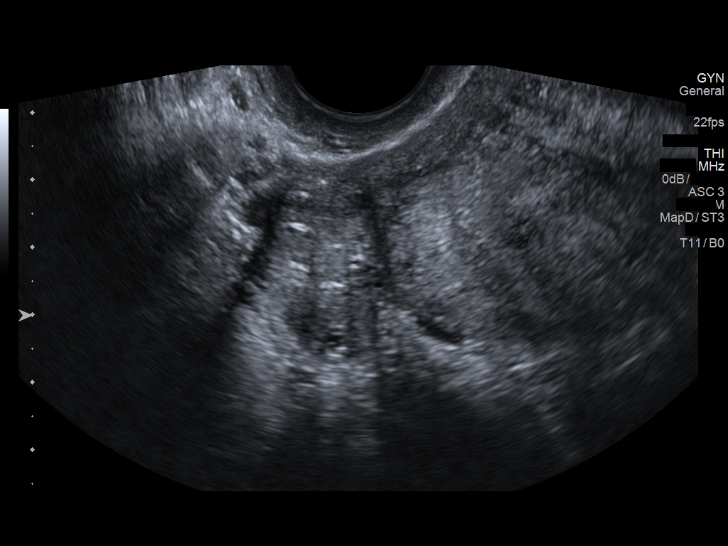
[im 34/45]
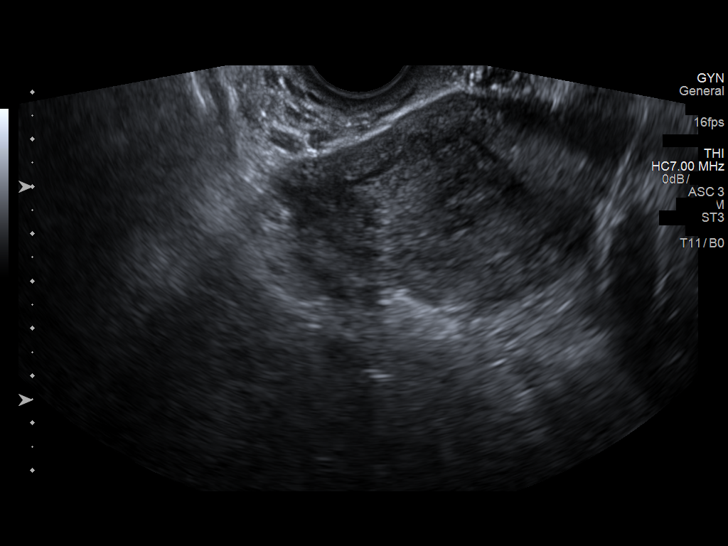
[im 37/45]
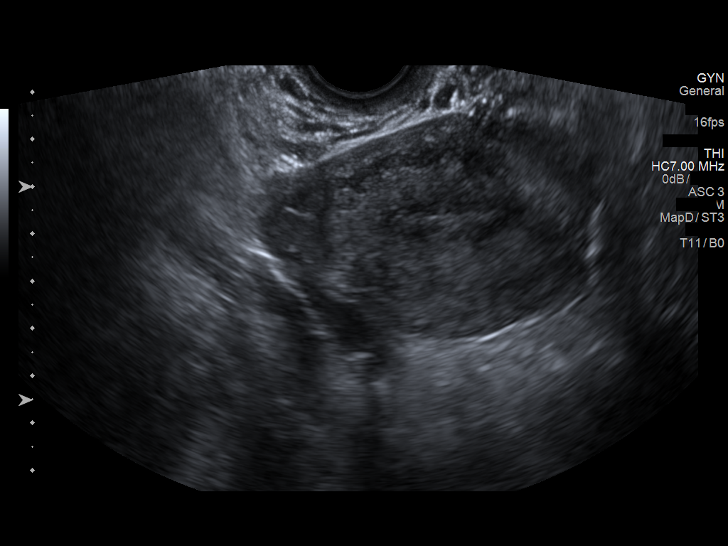
[im 41/45]
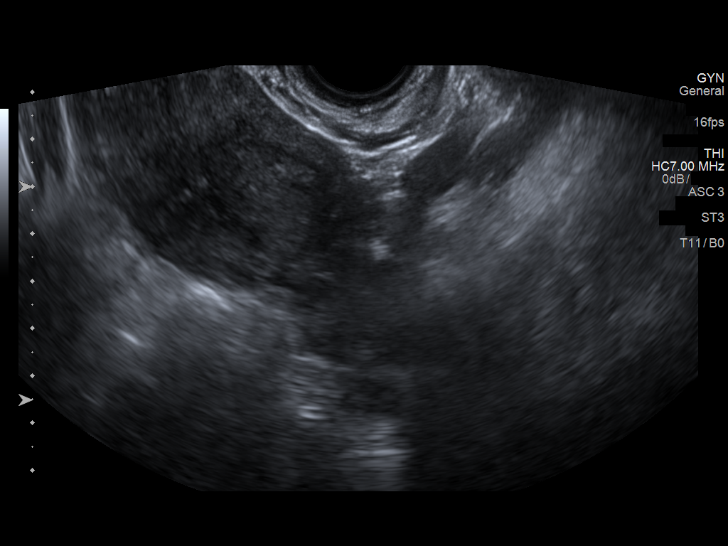
[im 45/45]
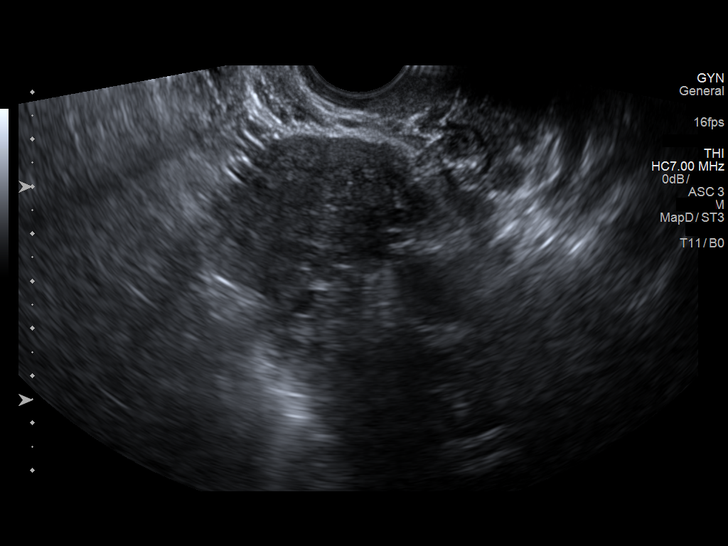

[13 of 25 positions shown; findings below may reference images not displayed]

FINDINGS: Uterus

Measurements: 8.7 x 4.5 x 5.9 cm. No fibroids or other mass
visualized.

Endometrium

Thickness: 3.4 mm.  No focal abnormality visualized.

Right ovary

Measurements: 2 x 1 x 1.5 cm. Incompletely visualized but grossly
unremarkable. Doppler flow was not confirmed in the right ovary.
However, this is probably technical due to poor visualization as
torsion is very unusual and a normal-sized right ovary.

Left ovary

Measurements: 3.2 x 1.5 x 2.7 cm. Normal appearance/no adnexal mass.

Other findings

No abnormal free fluid.
IMPRESSION: 1. The right ovary was not well visualized. However, visualized
portions are normal and there is no evidence of a right ovarian mass
or enlargement. Doppler blood flow was not confirmed in the right
ovary. However, this is most likely technical in nature due to the
difficult visualization. Torsion would be very unusual in the
absence of ovarian enlargement or mass. Dedicated Doppler imaging
could be attempted if clinically warranted.
2. No other abnormalities.
